# Patient Record
Sex: Male | Born: 1960 | Race: Black or African American | Hispanic: No | Smoking: Former smoker
Health system: Southern US, Community
[De-identification: ages and names within clinical notes are randomized; demographics above are authoritative.]

## PROBLEM LIST (undated history)

## (undated) DIAGNOSIS — Z720 Tobacco use: Secondary | ICD-10-CM

## (undated) DIAGNOSIS — Z9119 Patient's noncompliance with other medical treatment and regimen: Secondary | ICD-10-CM

## (undated) DIAGNOSIS — N486 Induration penis plastica: Secondary | ICD-10-CM

## (undated) DIAGNOSIS — I2510 Atherosclerotic heart disease of native coronary artery without angina pectoris: Secondary | ICD-10-CM

## (undated) DIAGNOSIS — E669 Obesity, unspecified: Secondary | ICD-10-CM

## (undated) DIAGNOSIS — C61 Malignant neoplasm of prostate: Secondary | ICD-10-CM

## (undated) DIAGNOSIS — Z91199 Patient's noncompliance with other medical treatment and regimen due to unspecified reason: Secondary | ICD-10-CM

## (undated) DIAGNOSIS — F1210 Cannabis abuse, uncomplicated: Secondary | ICD-10-CM

## (undated) DIAGNOSIS — I472 Ventricular tachycardia: Secondary | ICD-10-CM

## (undated) DIAGNOSIS — N529 Male erectile dysfunction, unspecified: Secondary | ICD-10-CM

## (undated) DIAGNOSIS — E119 Type 2 diabetes mellitus without complications: Secondary | ICD-10-CM

## (undated) DIAGNOSIS — E785 Hyperlipidemia, unspecified: Secondary | ICD-10-CM

## (undated) DIAGNOSIS — I10 Essential (primary) hypertension: Secondary | ICD-10-CM

## (undated) DIAGNOSIS — I4729 Other ventricular tachycardia: Secondary | ICD-10-CM

## (undated) HISTORY — DX: Induration penis plastica: N48.6

## (undated) HISTORY — PX: LAPAROSCOPIC RETROPUBIC PROSTATECTOMY: SUR794

## (undated) HISTORY — PX: OTHER SURGICAL HISTORY: SHX169

## (undated) HISTORY — DX: Male erectile dysfunction, unspecified: N52.9

## (undated) HISTORY — PX: TRACHEOSTOMY: SUR1362

---

## 2010-07-29 ENCOUNTER — Emergency Department (HOSPITAL_COMMUNITY): Admission: EM | Admit: 2010-07-29 | Discharge: 2010-07-29 | Payer: Self-pay | Admitting: Emergency Medicine

## 2011-08-21 ENCOUNTER — Emergency Department (HOSPITAL_COMMUNITY)
Admission: EM | Admit: 2011-08-21 | Discharge: 2011-08-21 | Disposition: A | Discharge status: Home / Self Care | Payer: BC Managed Care – PPO | Attending: Emergency Medicine | Admitting: Emergency Medicine

## 2011-08-21 ENCOUNTER — Encounter: Payer: Self-pay | Admitting: *Deleted

## 2011-08-21 DIAGNOSIS — H0011 Chalazion right upper eyelid: Secondary | ICD-10-CM

## 2011-08-21 DIAGNOSIS — H5710 Ocular pain, unspecified eye: Secondary | ICD-10-CM | POA: Insufficient documentation

## 2011-08-21 DIAGNOSIS — E785 Hyperlipidemia, unspecified: Secondary | ICD-10-CM | POA: Insufficient documentation

## 2011-08-21 DIAGNOSIS — I10 Essential (primary) hypertension: Secondary | ICD-10-CM | POA: Insufficient documentation

## 2011-08-21 DIAGNOSIS — H0019 Chalazion unspecified eye, unspecified eyelid: Secondary | ICD-10-CM | POA: Insufficient documentation

## 2011-08-21 HISTORY — DX: Hyperlipidemia, unspecified: E78.5

## 2011-08-21 HISTORY — DX: Essential (primary) hypertension: I10

## 2011-08-21 NOTE — ED Notes (Signed)
Patient states has had stye since the end of July. Had tried several OTC and home remedies. Denies any pain.

## 2011-08-21 NOTE — ED Notes (Signed)
Patient has tried otc treatment to the right eye lid.  Patient has noted swelling to the right upper eye lid.  Patient denies drainage.

## 2011-08-21 NOTE — ED Provider Notes (Signed)
History     CSN: [id number] Arrival date & time: 08/21/2011 10:45 AM   First MD Initiated Contact with Patient 08/21/11 1142      Chief Complaint  Patient presents with  . Eye Pain    (Consider location/radiation/quality/duration/timing/severity/associated sxs/prior treatment) Patient is a 50 y.o. male presenting with eye pain. The history is provided by the patient. No language interpreter was used.  Eye Pain This is a chronic problem. The current episode started more than 1 month ago. The problem has been unchanged. The symptoms are aggravated by nothing. The treatment provided no relief.  Patient with apparent chalazion upper lid of right eye.  Has been using an OTC stye ointment intermittently for several months without improvement.  No eye pain.  No visual disturbance.  Past Medical History  Diagnosis Date  . Hypertension   . Hyperlipemia     Past Surgical History  Procedure Date  . Tracheostomy     No family history on file.  History  Substance Use Topics  . Smoking status: Current Everyday Smoker  . Smokeless tobacco: Not on file  . Alcohol Use: Yes      Review of Systems  Eyes: Positive for pain.  All other systems reviewed and are negative.    Allergies  Penicillins  Home Medications   Current Outpatient Rx  Name Route Sig Dispense Refill  . AMLODIPINE BESYLATE 10 MG PO TABS Oral Take 10 mg by mouth daily.      Marland Kitchen PRESCRIPTION MEDICATION Oral Take 1 tablet by mouth daily. Patient does not know name of fluid pill. Is from [place], [place]. Doesn't know name of pharmacy.     Marland Kitchen SIMVASTATIN 10 MG PO TABS Oral Take 10 mg by mouth at bedtime.        BP 163/110  Pulse 92  Temp(Src) 98.3 F (36.8 C) (Oral)  Resp 20  SpO2 96%  Physical Exam  Constitutional: He is oriented to person, place, and time. He appears well-developed and well-nourished.  HENT:  Head: Normocephalic and atraumatic.  Eyes: Conjunctivae and EOM are normal. Pupils are equal, round,  and reactive to light. Right eye exhibits no discharge. Left eye exhibits no discharge. No scleral icterus.    Neck: Normal range of motion. Neck supple.  Cardiovascular: Normal rate and regular rhythm.   Pulmonary/Chest: Effort normal and breath sounds normal.  Abdominal: Soft. Bowel sounds are normal.  Musculoskeletal: Normal range of motion.  Neurological: He is alert and oriented to person, place, and time.  Skin: Skin is warm and dry.  Psychiatric: He has a normal mood and affect.    ED Course  Procedures (including critical care time)  Labs Reviewed - No data to display No results found.   No diagnosis found.   Needle drainage attempted without results.  Patient to follow-up with opthalmology. MDM          Jimmye Norman, NP 08/22/11 [phone]

## 2011-08-24 NOTE — ED Provider Notes (Signed)
Medical screening examination/treatment/procedure(s) were conducted as a shared visit with non-physician practitioner(s) and myself.  I personally evaluated the patient during the encounter. Right eye lid swelling. Mass on lid margin and in upper lid. Will need ophtho follow up.  Juliet Rude. Rubin Payor, MD 08/24/11 [phone]

## 2011-12-10 ENCOUNTER — Encounter (HOSPITAL_COMMUNITY): Payer: Self-pay | Admitting: Emergency Medicine

## 2011-12-10 ENCOUNTER — Emergency Department (HOSPITAL_COMMUNITY)
Admission: EM | Admit: 2011-12-10 | Discharge: 2011-12-11 | Disposition: A | Discharge status: Home / Self Care | Payer: BC Managed Care – PPO | Attending: Emergency Medicine | Admitting: Emergency Medicine

## 2011-12-10 DIAGNOSIS — S71009A Unspecified open wound, unspecified hip, initial encounter: Secondary | ICD-10-CM | POA: Insufficient documentation

## 2011-12-10 DIAGNOSIS — F17200 Nicotine dependence, unspecified, uncomplicated: Secondary | ICD-10-CM | POA: Insufficient documentation

## 2011-12-10 DIAGNOSIS — Y92009 Unspecified place in unspecified non-institutional (private) residence as the place of occurrence of the external cause: Secondary | ICD-10-CM | POA: Insufficient documentation

## 2011-12-10 DIAGNOSIS — S71109A Unspecified open wound, unspecified thigh, initial encounter: Secondary | ICD-10-CM | POA: Insufficient documentation

## 2011-12-10 DIAGNOSIS — S71112A Laceration without foreign body, left thigh, initial encounter: Secondary | ICD-10-CM

## 2011-12-10 DIAGNOSIS — Z880 Allergy status to penicillin: Secondary | ICD-10-CM | POA: Insufficient documentation

## 2011-12-10 DIAGNOSIS — Z23 Encounter for immunization: Secondary | ICD-10-CM | POA: Insufficient documentation

## 2011-12-10 DIAGNOSIS — W1789XA Other fall from one level to another, initial encounter: Secondary | ICD-10-CM | POA: Insufficient documentation

## 2011-12-10 DIAGNOSIS — E785 Hyperlipidemia, unspecified: Secondary | ICD-10-CM | POA: Insufficient documentation

## 2011-12-10 DIAGNOSIS — I10 Essential (primary) hypertension: Secondary | ICD-10-CM | POA: Insufficient documentation
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
## 2011-12-11 MED ORDER — TETANUS-DIPHTH-ACELL PERTUSSIS 5-2.5-18.5 LF-MCG/0.5 IM SUSP
0.5000 mL | Freq: Once | INTRAMUSCULAR | Status: AC
  Administered 2011-12-11: 0.5 mL via INTRAMUSCULAR
  Filled 2011-12-11: qty 0.5

## 2011-12-11 NOTE — ED Provider Notes (Signed)
Medical screening examination/treatment/procedure(s) were performed by non-physician practitioner and as supervising physician I was immediately available for consultation/collaboration.  Jasmine Awe, MD 12/11/11 [phone]
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
## 2011-12-11 NOTE — Discharge Instructions (Signed)
You were seen and treated today for your laceration. Your wound was cleaned with irrigation. You also had sutures placed to close your wound. These will need to be removed in 7-10 days. You may followup to primary care provider return to urgent care center or return to the emergency room to have been removed. If you develop any redness to the skin, increasing pain, leading or drainage, fever, chills please return to the emergency room.    Laceration Care, Adult A laceration is a cut or lesion that goes through all layers of the skin and into the tissue just beneath the skin. TREATMENT  Some lacerations may not require closure. Some lacerations may not be able to be closed due to an increased risk of infection. It is important to see your caregiver as soon as possible after an injury to minimize the risk of infection and maximize the opportunity for successful closure. If closure is appropriate, pain medicines may be given, if needed. The wound will be cleaned to help prevent infection. Your caregiver will use stitches (sutures), staples, wound glue (adhesive), or skin adhesive strips to repair the laceration. These tools bring the skin edges together to allow for faster healing and a better cosmetic outcome. However, all wounds will heal with a scar. Once the wound has healed, scarring can be minimized by covering the wound with sunscreen during the day for 1 full year. HOME CARE INSTRUCTIONS  For sutures or staples:  Keep the wound clean and dry.   If you were given a bandage (dressing), you should change it at least once a day. Also, change the dressing if it becomes wet or dirty, or as directed by your caregiver.   Wash the wound with soap and water 2 times a day. Rinse the wound off with water to remove all soap. Pat the wound dry with a clean towel.   After cleaning, apply a thin layer of the antibiotic ointment as recommended by your caregiver. This will help prevent infection and keep the  dressing from sticking.   You may shower as usual after the first 24 hours. Do not soak the wound in water until the sutures are removed.   Only take over-the-counter or prescription medicines for pain, discomfort, or fever as directed by your caregiver.   Get your sutures or staples removed as directed by your caregiver.  For skin adhesive strips:  Keep the wound clean and dry.   Do not get the skin adhesive strips wet. You may bathe carefully, using caution to keep the wound dry.   If the wound gets wet, pat it dry with a clean towel.   Skin adhesive strips will fall off on their own. You may trim the strips as the wound heals. Do not remove skin adhesive strips that are still stuck to the wound. They will fall off in time.  For wound adhesive:  You may briefly wet your wound in the shower or bath. Do not soak or scrub the wound. Do not swim. Avoid periods of heavy perspiration until the skin adhesive has fallen off on its own. After showering or bathing, gently pat the wound dry with a clean towel.   Do not apply liquid medicine, cream medicine, or ointment medicine to your wound while the skin adhesive is in place. This may loosen the film before your wound is healed.   If a dressing is placed over the wound, be careful not to apply tape directly over the skin adhesive. This may  cause the adhesive to be pulled off before the wound is healed.   Avoid prolonged exposure to sunlight or tanning lamps while the skin adhesive is in place. Exposure to ultraviolet light in the first year will darken the scar.   The skin adhesive will usually remain in place for 5 to 10 days, then naturally fall off the skin. Do not pick at the adhesive film.  You may need a tetanus shot if:  You cannot remember when you had your last tetanus shot.   You have never had a tetanus shot.  If you get a tetanus shot, your arm may swell, get red, and feel warm to the touch. This is common and not a problem. If  you need a tetanus shot and you choose not to have one, there is a rare chance of getting tetanus. Sickness from tetanus can be serious. SEEK MEDICAL CARE IF:   You have redness, swelling, or increasing pain in the wound.   You see a red line that goes away from the wound.   You have yellowish-white fluid (pus) coming from the wound.   You have a fever.   You notice a bad smell coming from the wound or dressing.   Your wound breaks open before or after sutures have been removed.   You notice something coming out of the wound such as wood or glass.   Your wound is on your hand or foot and you cannot move a finger or toe.  SEEK IMMEDIATE MEDICAL CARE IF:   Your pain is not controlled with prescribed medicine.   You have severe swelling around the wound causing pain and numbness or a change in color in your arm, hand, leg, or foot.   Your wound splits open and starts bleeding.   You have worsening numbness, weakness, or loss of function of any joint around or beyond the wound.   You develop painful lumps near the wound or on the skin anywhere on your body.  MAKE SURE YOU:   Understand these instructions.   Will watch your condition.   Will get help right away if you are not doing well or get worse.  Document Released: 08/31/2005 Document Revised: 08/20/2011 Document Reviewed: 02/24/2011 [facility] Patient Information 2012 [place], [place].   RESOURCE GUIDE  Dental Problems  Patients with Medicaid: [facility] [phone] [address]                                           [phone] Phone:  [phone]                                                  Phone:  [phone]  If unable to pay or uninsured, contact:  [facility] or [facility]. to become qualified for the adult dental clinic.  Chronic Pain Problems Contact [facility]  [phone] Patients need to be referred by their  primary care doctor.  Insufficient Money for Medicine Contact [employer] Way:  call "211" or [facility] [phone].  No Primary Care Doctor Call [facility]  [phone] Other agencies that provide inexpensive medical care    [facility]  [phone]    [facility]  [phone]    [facility]  [phone]    [facility]  [phone]    [facility]  [phone]    [facility]  [phone]  Psychological Services [facility]  [phone] [facility]  [phone] [facility]   [phone] (emergency services [phone])  Substance Abuse Resources [facility]  ([phone] [facility] ([phone] [facility] [phone] Floydene Flock [phone] Residential & Outpatient Substance Abuse Program  [phone]  Abuse/Neglect [facility] Child Abuse Hotline 435-539-1103 [facility] Main Child Abuse Hotline 430 250 8300 (After Hours)  Emergency Shelter [facility] [phone]  Maternity Homes [facility] at [facility] [phone] [facility] Services [phone]  MRSA Hotline #:   815 249 2661    [facility] Resources  [facility]     [employer]                          [facility] Dept. [address] [place]                       [address]      [address]  Blondell Reveal Phone:  [phone]                                   Phone:  [phone]                 Phone:  [phone]  [facility] Phone:  [phone]  [facility] Child Abuse Hotline 660-854-5335 [phone] (After Hours)

## 2011-12-17 ENCOUNTER — Emergency Department (HOSPITAL_COMMUNITY)
Admission: EM | Admit: 2011-12-17 | Discharge: 2011-12-17 | Disposition: A | Discharge status: Home / Self Care | Payer: BC Managed Care – PPO | Attending: Emergency Medicine | Admitting: Emergency Medicine

## 2011-12-17 ENCOUNTER — Encounter (HOSPITAL_COMMUNITY): Payer: Self-pay

## 2011-12-17 DIAGNOSIS — Z4802 Encounter for removal of sutures: Secondary | ICD-10-CM | POA: Insufficient documentation

## 2011-12-17 DIAGNOSIS — I10 Essential (primary) hypertension: Secondary | ICD-10-CM | POA: Insufficient documentation

## 2011-12-17 DIAGNOSIS — E785 Hyperlipidemia, unspecified: Secondary | ICD-10-CM | POA: Insufficient documentation

## 2011-12-17 DIAGNOSIS — F17200 Nicotine dependence, unspecified, uncomplicated: Secondary | ICD-10-CM | POA: Insufficient documentation

## 2011-12-17 NOTE — ED Provider Notes (Signed)
History     CSN: [id number]  Arrival date & time 12/17/11  4098   First MD Initiated Contact with Patient 12/17/11 [phone]      Chief Complaint  Patient presents with  . Suture / Staple Removal     The history is provided by the patient.   laceration repaired 7 days ago.  Patient presents for suture removal.  No report of erythema or drainage. No other complaints. No fevers or chills. No weakness or numbness  Past Medical History  Diagnosis Date  . Hypertension   . Hyperlipemia     Past Surgical History  Procedure Date  . Tracheostomy     No family history on file.  History  Substance Use Topics  . Smoking status: Current Everyday Smoker  . Smokeless tobacco: Not on file  . Alcohol Use: Yes      Review of Systems  All other systems reviewed and are negative.    Allergies  Penicillins  Home Medications   Current Outpatient Rx  Name Route Sig Dispense Refill  . AMLODIPINE BESYLATE 10 MG PO TABS Oral Take 10 mg by mouth daily.      Marland Kitchen SIMVASTATIN 10 MG PO TABS Oral Take 10 mg by mouth at bedtime.        BP 140/96  Pulse 97  Temp(Src) 98.2 F (36.8 C) (Oral)  Resp 16  SpO2 99%  Physical Exam  Nursing note and vitals reviewed. Constitutional: He is oriented to person, place, and time. He appears well-developed and well-nourished.  HENT:  Head: Normocephalic and atraumatic.  Eyes: EOM are normal.  Neck: Normal range of motion.  Pulmonary/Chest: Effort normal.  Musculoskeletal: Normal range of motion.       Healed laceration of left lateral thigh without secondary signs of infection. 3 sutures in place  Neurological: He is alert and oriented to person, place, and time.  Skin: Skin is warm and dry.  Psychiatric: He has a normal mood and affect. Judgment normal.    ED Course  Procedures (including critical care time) SUTURE REMOVAL Performed by: Lyanne Co Consent: Verbal consent obtained. Patient identity confirmed: provided demographic  data Time out: Immediately prior to procedure a "time out" was called to verify the correct patient, procedure, equipment, support staff and site/side marked as required. Location: left thigh Wound Appearance: clean Sutures/Staples Removed: 3 Patient tolerance: Patient tolerated the procedure well with no immediate complications.    Labs Reviewed - No data to display No results found.   1. Visit for suture removal       MDM  Well healed.  Sutures removed.  Infection warnings given.  Steri-Strips applied for additional support        Lyanne Co, MD 12/17/11 [phone]

## 2011-12-17 NOTE — Discharge Instructions (Signed)
Scar Minimization You will have a scar anytime you have surgery and a cut is made in the skin or you have something removed from your skin (mole, skin cancer, cyst). Although scars are unavoidable following surgery, there are ways to minimize their appearance. It is important to follow all the instructions you receive from your caregiver about wound care. How your wound heals will influence the appearance of your scar. If you do not follow the wound care instructions as directed, complications such as infection may occur. Wound instructions include keeping the wound clean, moist, and not letting the wound form a scab. Some people form scars that are raised and lumpy (hypertrophic) or larger than the initial wound (keloidal). HOME CARE INSTRUCTIONS   Follow wound care instructions as directed.   Keep the wound clean by washing it with soap and water.   Keep the wound moist with provided antibiotic cream or petroleum jelly until completely healed. Moisten twice a day for about 2 weeks.   Get stitches (sutures) taken out at the scheduled time.   Avoid touching or manipulating your wound unless needed. Wash your hands thoroughly before and after touching your wound.   Follow all restrictions such as limits on exercise or work. This depends on where your scar is located.   Keep the scar protected from sunburn. Cover the scar with sunscreen/sunblock with SPF 30 or higher.   Gently massage the scar using a circular motion to help minimize the appearance of the scar. Do this only after the wound has closed and all the sutures have been removed.   For hypertrophic or keloidal scars, there are several ways to treat and minimize their appearance. Methods include compression therapy, intralesional corticosteroids, laser therapy, or surgery. These methods are performed by your caregiver.  Remember that the scar may appear lighter or darker than your normal skin color. This difference in color should even  out with time. SEEK MEDICAL CARE IF:   You have a fever.   You develop signs of infection such as pain, redness, pus, and warmth.   You have questions or concerns.  Document Released: 02/18/2010 Document Revised: 08/20/2011 Document Reviewed: 02/18/2010 [employer] Patient Information 2012 [employer]. 

## 2011-12-17 NOTE — ED Notes (Signed)
Suture removal of lt. leg

## 2012-02-09 ENCOUNTER — Encounter (HOSPITAL_COMMUNITY): Payer: Self-pay | Admitting: *Deleted

## 2012-02-09 ENCOUNTER — Emergency Department (INDEPENDENT_AMBULATORY_CARE_PROVIDER_SITE_OTHER): Payer: BC Managed Care – PPO

## 2012-02-09 ENCOUNTER — Emergency Department (INDEPENDENT_AMBULATORY_CARE_PROVIDER_SITE_OTHER)
Admission: EM | Admit: 2012-02-09 | Discharge: 2012-02-09 | Disposition: A | Discharge status: Home / Self Care | Payer: BC Managed Care – PPO | Source: Home / Self Care | Attending: Family Medicine | Admitting: Family Medicine

## 2012-02-09 DIAGNOSIS — M79609 Pain in unspecified limb: Secondary | ICD-10-CM

## 2012-02-09 DIAGNOSIS — M79606 Pain in leg, unspecified: Secondary | ICD-10-CM

## 2012-02-09 MED ORDER — KETOROLAC TROMETHAMINE 30 MG/ML IJ SOLN
30.0000 mg | Freq: Once | INTRAMUSCULAR | Status: AC
  Administered 2012-02-09: 30 mg via INTRAMUSCULAR

## 2012-02-09 MED ORDER — KETOROLAC TROMETHAMINE 30 MG/ML IJ SOLN
INTRAMUSCULAR | Status: AC
  Filled 2012-02-09: qty 1

## 2012-02-09 MED ORDER — KETOROLAC TROMETHAMINE 60 MG/2ML IM SOLN
INTRAMUSCULAR | Status: AC
  Filled 2012-02-09: qty 2

## 2012-02-09 MED ORDER — DICLOFENAC POTASSIUM 50 MG PO TABS: 50.0000 mg | ORAL_TABLET | Freq: Three times a day (TID) | ORAL | Status: DC

## 2012-02-09 NOTE — ED Notes (Addendum)
C/o pain in left upper leg (posteior leg) after jumping into a pool yesterday. Has been using a heating pad and taking ibuprofen for pain without relief.

## 2012-02-09 NOTE — Discharge Instructions (Signed)
Use crutches, heat and medicine as needed, see orthopedic doctor for further care as needed.
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
## 2012-02-09 NOTE — ED Notes (Signed)
Patient transported to X-ray 

## 2012-02-09 NOTE — ED Notes (Signed)
MD at bedside. 

## 2012-06-21 ENCOUNTER — Emergency Department (INDEPENDENT_AMBULATORY_CARE_PROVIDER_SITE_OTHER)
Admission: EM | Admit: 2012-06-21 | Discharge: 2012-06-21 | Disposition: A | Discharge status: Home / Self Care | Payer: BC Managed Care – PPO | Source: Home / Self Care

## 2012-06-21 ENCOUNTER — Encounter (HOSPITAL_COMMUNITY): Payer: Self-pay | Admitting: *Deleted

## 2012-06-21 DIAGNOSIS — M25511 Pain in right shoulder: Secondary | ICD-10-CM

## 2012-06-21 DIAGNOSIS — M25519 Pain in unspecified shoulder: Secondary | ICD-10-CM

## 2012-06-21 MED ORDER — DICLOFENAC POTASSIUM 50 MG PO TABS: 50.0000 mg | ORAL_TABLET | Freq: Two times a day (BID) | ORAL | Status: AC

## 2012-06-21 MED ORDER — ACETAMINOPHEN-CODEINE #3 300-30 MG PO TABS: 1.0000 | ORAL_TABLET | Freq: Four times a day (QID) | ORAL | Status: DC | PRN

## 2012-06-21 MED ORDER — CYCLOBENZAPRINE HCL 10 MG PO TABS: 10.0000 mg | ORAL_TABLET | Freq: Two times a day (BID) | ORAL | Status: DC

## 2012-06-21 MED ORDER — KETOROLAC TROMETHAMINE 60 MG/2ML IM SOLN
60.0000 mg | Freq: Once | INTRAMUSCULAR | Status: AC
  Administered 2012-06-21: 60 mg via INTRAMUSCULAR

## 2012-06-21 MED ORDER — KETOROLAC TROMETHAMINE 60 MG/2ML IM SOLN
INTRAMUSCULAR | Status: AC
  Filled 2012-06-21: qty 2

## 2012-06-21 NOTE — ED Provider Notes (Signed)
History     CSN: [id number]  Arrival date & time 06/21/12  4098   None     Chief Complaint  Patient presents with  . Shoulder Pain    (Consider location/radiation/quality/duration/timing/severity/associated sxs/prior treatment) Patient is a 51 y.o. male presenting with shoulder pain. The history is provided by the patient.  Shoulder Pain This is a new problem. The current episode started more than 2 days ago. The problem has not changed since onset.Pertinent negatives include no chest pain and no headaches. The symptoms are aggravated by bending. The symptoms are relieved by heat and NSAIDs. He has tried a warm compress for the symptoms. The treatment provided no relief.  PMH-right rotator cuff injury.  Past Medical History  Diagnosis Date  . Hypertension   . Hyperlipemia   . High cholesterol     Past Surgical History  Procedure Date  . Tracheostomy     Family History  Problem Relation Age of Onset  . Parkinsonism Mother   . Cancer Father   . Cancer Brother     History  Substance Use Topics  . Smoking status: Current Every Day Smoker  . Smokeless tobacco: Not on file  . Alcohol Use: No      Review of Systems  Constitutional: Negative.   Respiratory: Negative.   Cardiovascular: Negative.  Negative for chest pain.  Musculoskeletal: Positive for arthralgias. Negative for myalgias, back pain, joint swelling and gait problem.  Skin: Negative.   Neurological: Positive for numbness. Negative for dizziness, tremors, seizures, syncope, facial asymmetry, speech difficulty, weakness, light-headedness and headaches.       Right thumb    Allergies  Penicillins  Home Medications   Current Outpatient Rx  Name Route Sig Dispense Refill  . AMLODIPINE BESYLATE 10 MG PO TABS Oral Take 10 mg by mouth daily.      . ENALAPRIL MALEATE 10 MG PO TABS Oral Take 10 mg by mouth daily.    Marland Kitchen SIMVASTATIN 10 MG PO TABS Oral Take 10 mg by mouth at bedtime.      .  ACETAMINOPHEN-CODEINE #3 300-30 MG PO TABS Oral Take 1 tablet by mouth every 6 (six) hours as needed for pain. 30 tablet 0  . CYCLOBENZAPRINE HCL 10 MG PO TABS Oral Take 1 tablet (10 mg total) by mouth 2 (two) times daily. Do not take if driving. 30 tablet 0  . DICLOFENAC POTASSIUM 50 MG PO TABS Oral Take 1 tablet (50 mg total) by mouth 2 (two) times daily. 60 tablet 2    BP 156/73  Pulse 93  Temp 98 F (36.7 C) (Oral)  SpO2 100%  Physical Exam  Nursing note and vitals reviewed. Constitutional: He is oriented to person, place, and time. Vital signs are normal. He appears well-developed and well-nourished. He is active and cooperative.  HENT:  Head: Normocephalic.  Eyes: Conjunctivae normal are normal. Pupils are equal, round, and reactive to light. No scleral icterus.  Neck: Trachea normal. Neck supple.  Cardiovascular: Normal rate and regular rhythm.   Pulmonary/Chest: Effort normal and breath sounds normal.  Musculoskeletal:       Right shoulder: He exhibits decreased range of motion, tenderness and spasm. He exhibits no bony tenderness, no swelling, no effusion, no crepitus, no deformity, no laceration, no pain, normal pulse and normal strength.       Left shoulder: Normal.       Cervical back: Normal.       Back:       Decreased ROM  secondary to pain, spasms upper back paracervical spine  Neurological: He is alert and oriented to person, place, and time. He has normal strength and normal reflexes. No cranial nerve deficit or sensory deficit. Coordination and gait normal. GCS eye subscore is 4. GCS verbal subscore is 5. GCS motor subscore is 6.  Skin: Skin is warm and dry.  Psychiatric: He has a normal mood and affect. His speech is normal and behavior is normal. Judgment and thought content normal. Cognition and memory are normal.    ED Course  Procedures (including critical care time)  Labs Reviewed - No data to display No results found.   1. Right shoulder pain        MDM  toradol in office, muscle relaxants, NSAIDS, tylenol with codeine for breakthrough pain; follow up with orthopedist in one week, cannot rule out rotator cuff injury.        Johnsie Kindred, NP 06/21/12 1011

## 2012-06-21 NOTE — ED Notes (Signed)
Right Shoulder Pain - onset 3 days ago - Hx. Rotator Cuff tear in same shoulder - no surgery . Sudden onset after working out - had aching and stated with severe pain last PM- C/O limited ROM and some numbness in right thumb and strange feeling in elbow.

## 2012-06-21 NOTE — ED Provider Notes (Signed)
Medical screening examination/treatment/procedure(s) were performed by resident physician or non-physician practitioner and as supervising physician I was immediately available for consultation/collaboration.   Barkley Bruns MD.    Linna Hoff, MD 06/21/12 1034
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
## 2013-08-22 ENCOUNTER — Other Ambulatory Visit (HOSPITAL_COMMUNITY): Payer: Self-pay | Admitting: Urology

## 2013-08-22 DIAGNOSIS — N400 Enlarged prostate without lower urinary tract symptoms: Secondary | ICD-10-CM

## 2013-08-23 ENCOUNTER — Ambulatory Visit (HOSPITAL_COMMUNITY): Payer: BC Managed Care – PPO | Attending: Urology

## 2014-07-11 ENCOUNTER — Inpatient Hospital Stay (HOSPITAL_COMMUNITY)
Admission: EM | Admit: 2014-07-11 | Discharge: 2014-07-15 | DRG: 247 | Disposition: A | Discharge status: Home / Self Care | Payer: BC Managed Care – PPO | Attending: Cardiology | Admitting: Cardiology

## 2014-07-11 ENCOUNTER — Encounter (HOSPITAL_COMMUNITY): Payer: Self-pay | Admitting: Emergency Medicine

## 2014-07-11 ENCOUNTER — Emergency Department (HOSPITAL_COMMUNITY): Payer: BC Managed Care – PPO

## 2014-07-11 ENCOUNTER — Encounter (HOSPITAL_COMMUNITY)
Admission: EM | Disposition: A | Discharge status: Home / Self Care | Payer: BC Managed Care – PPO | Source: Home / Self Care | Attending: Cardiology

## 2014-07-11 DIAGNOSIS — Z720 Tobacco use: Secondary | ICD-10-CM | POA: Diagnosis present

## 2014-07-11 DIAGNOSIS — I10 Essential (primary) hypertension: Secondary | ICD-10-CM | POA: Diagnosis present

## 2014-07-11 DIAGNOSIS — E780 Pure hypercholesterolemia: Secondary | ICD-10-CM | POA: Diagnosis present

## 2014-07-11 DIAGNOSIS — Z79899 Other long term (current) drug therapy: Secondary | ICD-10-CM

## 2014-07-11 DIAGNOSIS — Z955 Presence of coronary angioplasty implant and graft: Secondary | ICD-10-CM

## 2014-07-11 DIAGNOSIS — E785 Hyperlipidemia, unspecified: Secondary | ICD-10-CM

## 2014-07-11 DIAGNOSIS — I25110 Atherosclerotic heart disease of native coronary artery with unstable angina pectoris: Secondary | ICD-10-CM

## 2014-07-11 DIAGNOSIS — I214 Non-ST elevation (NSTEMI) myocardial infarction: Secondary | ICD-10-CM | POA: Diagnosis present

## 2014-07-11 DIAGNOSIS — Z8546 Personal history of malignant neoplasm of prostate: Secondary | ICD-10-CM

## 2014-07-11 DIAGNOSIS — I213 ST elevation (STEMI) myocardial infarction of unspecified site: Secondary | ICD-10-CM

## 2014-07-11 DIAGNOSIS — I2119 ST elevation (STEMI) myocardial infarction involving other coronary artery of inferior wall: Secondary | ICD-10-CM

## 2014-07-11 DIAGNOSIS — C61 Malignant neoplasm of prostate: Secondary | ICD-10-CM | POA: Diagnosis present

## 2014-07-11 DIAGNOSIS — F1210 Cannabis abuse, uncomplicated: Secondary | ICD-10-CM | POA: Diagnosis present

## 2014-07-11 DIAGNOSIS — E1169 Type 2 diabetes mellitus with other specified complication: Secondary | ICD-10-CM

## 2014-07-11 DIAGNOSIS — I2510 Atherosclerotic heart disease of native coronary artery without angina pectoris: Secondary | ICD-10-CM | POA: Diagnosis present

## 2014-07-11 DIAGNOSIS — I209 Angina pectoris, unspecified: Secondary | ICD-10-CM

## 2014-07-11 DIAGNOSIS — E119 Type 2 diabetes mellitus without complications: Secondary | ICD-10-CM | POA: Diagnosis present

## 2014-07-11 DIAGNOSIS — R079 Chest pain, unspecified: Secondary | ICD-10-CM | POA: Diagnosis present

## 2014-07-11 DIAGNOSIS — I4729 Other ventricular tachycardia: Secondary | ICD-10-CM

## 2014-07-11 DIAGNOSIS — E669 Obesity, unspecified: Secondary | ICD-10-CM | POA: Diagnosis present

## 2014-07-11 DIAGNOSIS — I2111 ST elevation (STEMI) myocardial infarction involving right coronary artery: Secondary | ICD-10-CM

## 2014-07-11 DIAGNOSIS — F17210 Nicotine dependence, cigarettes, uncomplicated: Secondary | ICD-10-CM | POA: Diagnosis present

## 2014-07-11 DIAGNOSIS — I472 Ventricular tachycardia, unspecified: Secondary | ICD-10-CM

## 2014-07-11 DIAGNOSIS — Z6829 Body mass index (BMI) 29.0-29.9, adult: Secondary | ICD-10-CM

## 2014-07-11 DIAGNOSIS — I519 Heart disease, unspecified: Secondary | ICD-10-CM

## 2014-07-11 HISTORY — DX: Cannabis abuse, uncomplicated: F12.10

## 2014-07-11 HISTORY — DX: Type 2 diabetes mellitus without complications: E11.9

## 2014-07-11 HISTORY — DX: Patient's noncompliance with other medical treatment and regimen: Z91.19

## 2014-07-11 HISTORY — DX: Tobacco use: Z72.0

## 2014-07-11 HISTORY — DX: Ventricular tachycardia: I47.2

## 2014-07-11 HISTORY — DX: Atherosclerotic heart disease of native coronary artery without angina pectoris: I25.10

## 2014-07-11 HISTORY — PX: LEFT HEART CATHETERIZATION WITH CORONARY ANGIOGRAM: SHX5451

## 2014-07-11 HISTORY — DX: Patient's noncompliance with other medical treatment and regimen due to unspecified reason: Z91.199

## 2014-07-11 HISTORY — DX: Other ventricular tachycardia: I47.29

## 2014-07-11 HISTORY — DX: Malignant neoplasm of prostate: C61

## 2014-07-11 HISTORY — DX: Obesity, unspecified: E66.9

## 2014-07-11 LAB — CBC
HCT: 46.2 % (ref 39.0–52.0)
HCT: 44.3 % (ref 39.0–52.0)
Hemoglobin: 16.2 g/dL (ref 13.0–17.0)
Hemoglobin: 15.6 g/dL (ref 13.0–17.0)
MCH: 30.7 pg (ref 26.0–34.0)
MCH: 30.1 pg (ref 26.0–34.0)
MCHC: 35.1 g/dL (ref 30.0–36.0)
MCHC: 35.2 g/dL (ref 30.0–36.0)
MCV: 87.5 fL (ref 78.0–100.0)
MCV: 85.5 fL (ref 78.0–100.0)
PLATELETS: 295 10*3/uL (ref 150–400)
Platelets: 309 10*3/uL (ref 150–400)
RBC: 5.28 MIL/uL (ref 4.22–5.81)
RBC: 5.18 MIL/uL (ref 4.22–5.81)
RDW: 14.4 % (ref 11.5–15.5)
RDW: 14.4 % (ref 11.5–15.5)
WBC: 9.5 10*3/uL (ref 4.0–10.5)
WBC: 9.6 10*3/uL (ref 4.0–10.5)

## 2014-07-11 LAB — GLUCOSE, CAPILLARY
Glucose-Capillary: 163 mg/dL — ABNORMAL HIGH (ref 70–99)
Glucose-Capillary: 190 mg/dL — ABNORMAL HIGH (ref 70–99)
Glucose-Capillary: 190 mg/dL — ABNORMAL HIGH (ref 70–99)

## 2014-07-11 LAB — CREATININE, SERUM
Creatinine, Ser: 0.94 mg/dL (ref 0.50–1.35)
GFR calc Af Amer: >90 mL/min
GFR calc non Af Amer: >90 mL/min

## 2014-07-11 LAB — BASIC METABOLIC PANEL
ANION GAP: 18 — AB (ref 5–15)
BUN: 17 mg/dL (ref 6–23)
CHLORIDE: 98 meq/L (ref 96–112)
CO2: 23 mEq/L (ref 19–32)
Calcium: 10.2 mg/dL (ref 8.4–10.5)
Creatinine, Ser: 1.22 mg/dL (ref 0.50–1.35)
GFR, EST AFRICAN AMERICAN: 77 mL/min — AB (ref >90)
GFR, EST NON AFRICAN AMERICAN: 67 mL/min — AB (ref >90)
Glucose, Bld: 200 mg/dL — ABNORMAL HIGH (ref 70–99)
Potassium: 3.8 mEq/L (ref 3.7–5.3)
Sodium: 139 mEq/L (ref 137–147)

## 2014-07-11 LAB — HEMOGLOBIN A1C
Hgb A1c MFr Bld: 7.3 % — ABNORMAL HIGH
Mean Plasma Glucose: 163 mg/dL — ABNORMAL HIGH

## 2014-07-11 LAB — TROPONIN I
Troponin I: >20 ng/mL
Troponin I: >20 ng/mL
Troponin I: >20 ng/mL

## 2014-07-11 LAB — PRO B NATRIURETIC PEPTIDE: PRO B NATRI PEPTIDE: 13.9 pg/mL (ref 0–125)

## 2014-07-11 LAB — MRSA PCR SCREENING: MRSA by PCR: NEGATIVE

## 2014-07-11 LAB — I-STAT TROPONIN, ED: Troponin i, poc: 0.86 ng/mL (ref 0.00–0.08)

## 2014-07-11 LAB — TSH: TSH: 1.26 u[IU]/mL (ref 0.350–4.500)

## 2014-07-11 LAB — POCT ACTIVATED CLOTTING TIME: Activated Clotting Time: 484 seconds

## 2014-07-11 SURGERY — LEFT HEART CATHETERIZATION WITH CORONARY ANGIOGRAM
Anesthesia: LOCAL

## 2014-07-11 MED ORDER — NITROGLYCERIN 0.4 MG SL SUBL
0.4000 mg | SUBLINGUAL_TABLET | SUBLINGUAL | Status: DC | PRN
Start: 2014-07-11 — End: 2014-07-15

## 2014-07-11 MED ORDER — NITROGLYCERIN 0.4 MG SL SUBL
0.4000 mg | SUBLINGUAL_TABLET | SUBLINGUAL | Status: DC | PRN
  Administered 2014-07-11 (×3): 0.4 mg via SUBLINGUAL

## 2014-07-11 MED ORDER — CAPTOPRIL 6.25 MG HALF TABLET
6.2500 mg | ORAL_TABLET | Freq: Three times a day (TID) | ORAL | Status: AC
  Administered 2014-07-11 (×3): 6.25 mg via ORAL
  Filled 2014-07-11 (×3): qty 1

## 2014-07-11 MED ORDER — NITROGLYCERIN IN D5W 200-5 MCG/ML-% IV SOLN
2.0000 ug/min | INTRAVENOUS | Status: DC
  Administered 2014-07-11: 50 ug/min via INTRAVENOUS
  Filled 2014-07-11: qty 250

## 2014-07-11 MED ORDER — HEPARIN (PORCINE) IN NACL 100-0.45 UNIT/ML-% IJ SOLN
1100.0000 [IU]/h | INTRAMUSCULAR | Status: DC
  Filled 2014-07-11: qty 250

## 2014-07-11 MED ORDER — INSULIN ASPART 100 UNIT/ML ~~LOC~~ SOLN
0.0000 [IU] | Freq: Three times a day (TID) | SUBCUTANEOUS | Status: DC
  Administered 2014-07-11 (×2): 3 [IU] via SUBCUTANEOUS
  Administered 2014-07-12: 2 [IU] via SUBCUTANEOUS
  Administered 2014-07-12 (×2): 3 [IU] via SUBCUTANEOUS
  Administered 2014-07-13: 2 [IU] via SUBCUTANEOUS
  Administered 2014-07-13: 3 [IU] via SUBCUTANEOUS
  Administered 2014-07-13 – 2014-07-14 (×3): 2 [IU] via SUBCUTANEOUS
  Administered 2014-07-15: 3 [IU] via SUBCUTANEOUS
  Administered 2014-07-15: 2 [IU] via SUBCUTANEOUS

## 2014-07-11 MED ORDER — HEPARIN SODIUM (PORCINE) 1000 UNIT/ML IJ SOLN
INTRAMUSCULAR | Status: AC
  Filled 2014-07-11: qty 1

## 2014-07-11 MED ORDER — HEPARIN SODIUM (PORCINE) 5000 UNIT/ML IJ SOLN
4000.0000 [IU] | Freq: Once | INTRAMUSCULAR | Status: AC
  Administered 2014-07-11: 4000 [IU] via INTRAVENOUS
  Filled 2014-07-11: qty 1

## 2014-07-11 MED ORDER — ATORVASTATIN CALCIUM 80 MG PO TABS
80.0000 mg | ORAL_TABLET | Freq: Every day | ORAL | Status: DC
  Administered 2014-07-11 – 2014-07-14 (×4): 80 mg via ORAL
  Filled 2014-07-11 (×6): qty 1

## 2014-07-11 MED ORDER — ACETAMINOPHEN 325 MG PO TABS
650.0000 mg | ORAL_TABLET | ORAL | Status: DC | PRN
  Administered 2014-07-12: 650 mg via ORAL
  Filled 2014-07-11: qty 2

## 2014-07-11 MED ORDER — NITROGLYCERIN 1 MG/10 ML FOR IR/CATH LAB
INTRA_ARTERIAL | Status: AC
  Filled 2014-07-11: qty 10

## 2014-07-11 MED ORDER — MORPHINE SULFATE 2 MG/ML IJ SOLN
INTRAMUSCULAR | Status: AC
  Filled 2014-07-11: qty 1

## 2014-07-11 MED ORDER — NITROGLYCERIN IN D5W 200-5 MCG/ML-% IV SOLN: 0.0000 ug/min | INTRAVENOUS | Status: DC

## 2014-07-11 MED ORDER — METOPROLOL TARTRATE 12.5 MG HALF TABLET
12.5000 mg | ORAL_TABLET | Freq: Two times a day (BID) | ORAL | Status: DC
  Administered 2014-07-11 (×2): 12.5 mg via ORAL
  Filled 2014-07-11 (×4): qty 1

## 2014-07-11 MED ORDER — ONDANSETRON HCL 4 MG/2ML IJ SOLN
4.0000 mg | Freq: Once | INTRAMUSCULAR | Status: AC
  Administered 2014-07-11: 4 mg via INTRAVENOUS

## 2014-07-11 MED ORDER — ACETAMINOPHEN 325 MG PO TABS: 650.0000 mg | ORAL_TABLET | ORAL | Status: DC | PRN

## 2014-07-11 MED ORDER — MORPHINE SULFATE 4 MG/ML IJ SOLN
4.0000 mg | Freq: Once | INTRAMUSCULAR | Status: AC
  Administered 2014-07-11: 2 mg via INTRAVENOUS
  Filled 2014-07-11: qty 1

## 2014-07-11 MED ORDER — MIDAZOLAM HCL 2 MG/2ML IJ SOLN
INTRAMUSCULAR | Status: AC
  Filled 2014-07-11: qty 2

## 2014-07-11 MED ORDER — ASPIRIN EC 81 MG PO TBEC: 81.0000 mg | DELAYED_RELEASE_TABLET | Freq: Every day | ORAL | Status: DC

## 2014-07-11 MED ORDER — MORPHINE SULFATE 2 MG/ML IJ SOLN
2.0000 mg | INTRAMUSCULAR | Status: DC | PRN
  Administered 2014-07-11 – 2014-07-13 (×4): 2 mg via INTRAVENOUS
  Filled 2014-07-11 (×3): qty 1

## 2014-07-11 MED ORDER — ONDANSETRON HCL 4 MG/2ML IJ SOLN
INTRAMUSCULAR | Status: AC
  Filled 2014-07-11: qty 2

## 2014-07-11 MED ORDER — SODIUM CHLORIDE 0.9 % IJ SOLN: 3.0000 mL | INTRAMUSCULAR | Status: DC | PRN

## 2014-07-11 MED ORDER — HEPARIN BOLUS VIA INFUSION
4000.0000 [IU] | Freq: Once | INTRAVENOUS | Status: DC
  Filled 2014-07-11: qty 4000

## 2014-07-11 MED ORDER — VERAPAMIL HCL 2.5 MG/ML IV SOLN
INTRAVENOUS | Status: AC
  Filled 2014-07-11: qty 2

## 2014-07-11 MED ORDER — AMIODARONE IV BOLUS ONLY 150 MG/100ML
150.0000 mg | Freq: Once | INTRAVENOUS | Status: AC
  Administered 2014-07-11: 150 mg via INTRAVENOUS

## 2014-07-11 MED ORDER — ASPIRIN 81 MG PO CHEW: 81.0000 mg | CHEWABLE_TABLET | Freq: Every day | ORAL | Status: DC

## 2014-07-11 MED ORDER — SODIUM CHLORIDE 0.9 % IV SOLN: 250.0000 mL | INTRAVENOUS | Status: DC | PRN

## 2014-07-11 MED ORDER — ASPIRIN 81 MG PO CHEW
81.0000 mg | CHEWABLE_TABLET | Freq: Every day | ORAL | Status: DC
  Administered 2014-07-12 – 2014-07-13 (×2): 81 mg via ORAL
  Filled 2014-07-11 (×2): qty 1

## 2014-07-11 MED ORDER — ACETAMINOPHEN-CODEINE #3 300-30 MG PO TABS: 1.0000 | ORAL_TABLET | Freq: Four times a day (QID) | ORAL | Status: DC | PRN

## 2014-07-11 MED ORDER — SODIUM CHLORIDE 0.9 % IV SOLN
1.0000 mL/kg/h | INTRAVENOUS | Status: AC
  Administered 2014-07-11: 1 mL/kg/h via INTRAVENOUS

## 2014-07-11 MED ORDER — BIVALIRUDIN 250 MG IV SOLR
INTRAVENOUS | Status: AC
  Filled 2014-07-11: qty 250

## 2014-07-11 MED ORDER — FENTANYL CITRATE 0.05 MG/ML IJ SOLN
INTRAMUSCULAR | Status: AC
  Filled 2014-07-11: qty 2

## 2014-07-11 MED ORDER — PROMETHAZINE HCL 25 MG/ML IJ SOLN
12.5000 mg | INTRAMUSCULAR | Status: DC | PRN
  Administered 2014-07-11: 12.5 mg via INTRAVENOUS
  Administered 2014-07-12: 25 mg via INTRAVENOUS
  Filled 2014-07-11 (×2): qty 1

## 2014-07-11 MED ORDER — ASPIRIN 81 MG PO CHEW
CHEWABLE_TABLET | ORAL | Status: AC
  Administered 2014-07-11: 324 mg via ORAL
  Filled 2014-07-11: qty 4

## 2014-07-11 MED ORDER — SODIUM CHLORIDE 0.9 % IJ SOLN
3.0000 mL | Freq: Two times a day (BID) | INTRAMUSCULAR | Status: DC
  Administered 2014-07-11 – 2014-07-15 (×8): 3 mL via INTRAVENOUS

## 2014-07-11 MED ORDER — HEPARIN (PORCINE) IN NACL 2-0.9 UNIT/ML-% IJ SOLN
INTRAMUSCULAR | Status: AC
  Filled 2014-07-11: qty 1000

## 2014-07-11 MED ORDER — TICAGRELOR 90 MG PO TABS
90.0000 mg | ORAL_TABLET | Freq: Two times a day (BID) | ORAL | Status: DC
  Administered 2014-07-11 – 2014-07-15 (×7): 90 mg via ORAL
  Filled 2014-07-11 (×9): qty 1

## 2014-07-11 MED ORDER — MORPHINE SULFATE 2 MG/ML IJ SOLN
INTRAMUSCULAR | Status: AC
  Administered 2014-07-11: 2 mg via INTRAVENOUS
  Filled 2014-07-11: qty 1

## 2014-07-11 MED ORDER — LIDOCAINE HCL (PF) 1 % IJ SOLN
INTRAMUSCULAR | Status: AC
  Filled 2014-07-11: qty 30

## 2014-07-11 MED ORDER — ASPIRIN 81 MG PO CHEW
324.0000 mg | CHEWABLE_TABLET | Freq: Once | ORAL | Status: AC
  Administered 2014-07-11: 324 mg via ORAL
  Filled 2014-07-11: qty 4

## 2014-07-11 MED ORDER — HYDRALAZINE HCL 20 MG/ML IJ SOLN
10.0000 mg | INTRAMUSCULAR | Status: DC | PRN
  Administered 2014-07-11 – 2014-07-12 (×4): 10 mg via INTRAVENOUS
  Filled 2014-07-11 (×4): qty 1

## 2014-07-11 MED ORDER — ONDANSETRON HCL 4 MG/2ML IJ SOLN
4.0000 mg | Freq: Four times a day (QID) | INTRAMUSCULAR | Status: DC | PRN
  Administered 2014-07-11 (×2): 4 mg via INTRAVENOUS
  Filled 2014-07-11 (×2): qty 2

## 2014-07-11 MED ORDER — HEPARIN SODIUM (PORCINE) 5000 UNIT/ML IJ SOLN
5000.0000 [IU] | Freq: Three times a day (TID) | INTRAMUSCULAR | Status: DC
  Administered 2014-07-11 – 2014-07-15 (×12): 5000 [IU] via SUBCUTANEOUS
  Filled 2014-07-11 (×14): qty 1

## 2014-07-11 MED ORDER — AMLODIPINE BESYLATE 5 MG PO TABS
5.0000 mg | ORAL_TABLET | Freq: Every day | ORAL | Status: DC
  Administered 2014-07-11: 5 mg via ORAL
  Filled 2014-07-11 (×2): qty 1

## 2014-07-11 MED ORDER — LISINOPRIL 20 MG PO TABS
20.0000 mg | ORAL_TABLET | Freq: Every day | ORAL | Status: DC
  Administered 2014-07-12 – 2014-07-15 (×3): 20 mg via ORAL
  Filled 2014-07-11 (×4): qty 1

## 2014-07-11 MED ORDER — ALPRAZOLAM 0.25 MG PO TABS
0.2500 mg | ORAL_TABLET | Freq: Three times a day (TID) | ORAL | Status: DC | PRN
  Administered 2014-07-11: 0.25 mg via ORAL
  Filled 2014-07-11: qty 1

## 2014-07-11 NOTE — Progress Notes (Signed)
  Echocardiogram 2D Echocardiogram has been performed.  Thomas Watts 07/11/2014, 3:26 PM

## 2014-07-11 NOTE — ED Notes (Signed)
Pt started having CP at 4am ,with nausea, and SOB,  pt having runs of vtach.

## 2014-07-11 NOTE — ED Notes (Signed)
Cardiology at BS

## 2014-07-11 NOTE — CV Procedure (Signed)
Cardiac Catheterization Procedure Note  Name: Thomas Watts MRN: [id number] DOB: May 21, 1961  Procedure: Left Heart Cath, Selective Coronary Angiography, LV angiography, PTCA and stenting of the proximal to mid RCA  Indication: 53 yo BM with NSTEMI and repetitive nonsustained VT and ongoing chest pain.  Procedural Details:  The right wrist was prepped, draped, and anesthetized with 1% lidocaine. Using the modified Seldinger technique, a 6 French slender sheath was introduced into the right radial artery. 3 mg of verapamil was administered through the sheath, weight-based unfractionated heparin was administered intravenously. Standard Judkins catheters were used for selective coronary angiography and left ventriculography. Catheter exchanges were performed over an exchange length guidewire.  PROCEDURAL FINDINGS Hemodynamics: AO 140/93 mean 115 mm Hg LV 136/7 mm Hg   Coronary angiography: Coronary dominance: right  Left mainstem: Normal  Left anterior descending (LAD): The LAD is a large vessel with diffuse 30% disease in the mid to distal vessel.   There is a small ramus intermediate branch with 80% proximal stenosis.  Left circumflex (LCx): The LCx is a large vessel giving rise to a single large branching OM. There is diffuse stenosis in the mid LCx prior to the OM up to 80%.  Right coronary artery (RCA): The RCA is diffusely diseased in the proximal to mid vessel. There is a focal 95% stenosis in the proximal segment  Left ventriculography: Left ventricular systolic function is normal, LVEF is estimated at 55-65%, there is no significant mitral regurgitation   PCI Note:  Following the diagnostic procedure, the decision was made to proceed with PCI of the RCA.  Weight-based bivalirudin was given for anticoagulation. Brilinta 180 mg was given orally. Once a therapeutic ACT was achieved, a 6 [place] Hockey stick guide catheter was inserted.  A prowater coronary guidewire was used  to cross the lesion. It was difficult to wire the past the crux and the wire was positioned in a side branch. The lesion was predilated with a 2.0 mm balloon.  The lesion was then stented with a 2.5 x 38 mm Promus stent. At this point there was evidence of a focal dissection at the crux. A second Choice PT wire was passed across this area into the distal RCA. We attempted to pass a stent into this region but it would not cross. We predilated the area with a 2.5 mm balloon but still were unable to cross with the stent. We then used a Guideliner catheter and brought this through the first stent over the 2.5 mm balloon. With Guideliner support we were then able to deliver a 2.5 x 32 mm Promus stent to cover the dissection at the crux and overlap with the first stent.  The entire stented segment  was postdilated with a 3.0 mm noncompliant balloon.  Following PCI, there was 0% residual stenosis and TIMI-3 flow. Final angiography confirmed an excellent result. The patient tolerated the procedure well. There were no immediate procedural complications. A TR band was used for radial hemostasis. The patient was transferred to the post catheterization recovery area for further monitoring.  PCI Data: Vessel - RCA/Segment - proximal to mid Percent Stenosis (pre)  95% TIMI-flow 2 Stent 2.5 x 38 and 2.5 x 32 mm Promus stents post dilated to 3.0 mm. Percent Stenosis (post) 0% TIMI-flow (post) 3  Final Conclusions:   1. Severe 2 vessel obstructive CAD 2. Normal LV function 3. Successful stenting of the proximal to mid RCA with overlapping DES.     Recommendations:  DAPT for  at least one year. Aggressive risk factor modification. Would consider bringing the patient back to the lab for stenting of the LCx on Friday.  Shanda Cadotte [place], [place] 07/11/2014, 7:41 AM

## 2014-07-11 NOTE — Care Management Note (Signed)
    Page 1 of 1   07/11/2014     10:15:44 AM CARE MANAGEMENT NOTE 07/11/2014  Patient:  Thomas Watts, Thomas Watts   Account Number:  [id number]  Date Initiated:  07/11/2014  Documentation initiated by:  Elissa Hefty  Subjective/Objective Assessment:   adm w mi     Action/Plan:   lives at home   Anticipated DC Date:     Anticipated DC Plan:  [place]  CM consult  Medication Assistance      Choice offered to / List presented to:             Status of service:   Medicare Important Message given?   (If response is "NO", the following Medicare IM given date fields will be blank) Date Medicare IM given:   Medicare IM given by:   Date Additional Medicare IM given:   Additional Medicare IM given by:    Discharge Disposition:    Per UR Regulation:  Reviewed for med. necessity/level of care/duration of stay  If discussed at [place] of Stay Meetings, dates discussed:    Comments:  10/28 1014 debbie Carlyne Keehan rn,bsn gave pt brilinta 30day free and copay assist card.
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
## 2014-07-11 NOTE — ED Notes (Signed)
Pt c/o left sided chest pain radiating to left arm, shortness of breath. Pain woke patient. Pt describes the pain at pressure and squeezing. Pt reports breaking out into sweats at home, c/o nausea with chest pain.

## 2014-07-11 NOTE — Progress Notes (Signed)
Chaplain responded to code stemi in ED. Chaplain spoke with pt's girlfriend, Netta Corrigan, outside of room, offering emotional support. Chaplain then entered pt room and spoke with pt. Pt said, "I'm scared to death," and chaplain prayed with him. A few minutes later chaplain and Netta Corrigan entered pt room together and pt was calmer as a result of of medication helping alleviate his pain. Giselle encouraged pt to "relax" as he could get "worked up" sometimes. Giselle shared with chaplain that pt is "always anxious." Chaplain provided hospitality and escorted Netta Corrigan to cath lab waiting area. Will refer to spiritual care staff for follow-up.    07/11/14 8270  Clinical Encounter Type  Visited With Patient;Family;Patient and family together  Visit Type Other (Comment);Spiritual support (stemi)  Spiritual Encounters  Spiritual Needs Prayer;Emotional  Stress Factors  Patient Stress Factors Health changes  Family Stress Factors Exhausted   Vanetta Mulders 07/11/2014 6:42 AM

## 2014-07-11 NOTE — Progress Notes (Signed)
07/11/14 1500  Clinical Encounter Type  Visited With Patient;Family  Visit Type Follow-up;Spiritual support  Referral From Nurse  Stress Factors  Patient Stress Factors Health changes  Advance Directives (For Healthcare)  Does patient have an advance directive? No  Would patient like information on creating an advanced directive? Yes - Educational materials given   Chaplain was referred to patient via spiritual care consult. Consult indicated that patient was interested in filling out or updating an advanced directive. Patient was feeling very sick when chaplain visited. Therefore chaplain simply left a blank advanced directive with him and offered further assistance with the document if needed at a later time. Chaplain plans to follow up with the patient at a later time. Coleby Yett, Claudius Sis, Chaplain 3:37 PM

## 2014-07-11 NOTE — ED Provider Notes (Signed)
CSN: [id number]     Arrival date & time 07/11/14  2025 History   First MD Initiated Contact with Patient 07/11/14 0546     Chief Complaint  Patient presents with  . Chest Pain     (Consider location/radiation/quality/duration/timing/severity/associated sxs/prior Treatment) HPI 53 yo male presents to the ER from home with complaint of chest pain, left arm pain starting at 4 am, waking from sleep.  Pt has h/o HTN, prostate resection.  Pt is 1-2 ppd smoker.  Pt has had nausea, diaphoresis.  No prior h/o same.  Some pain radiating into his back.  Pt googled his symptoms as he had left arm numbness Past Medical History  Diagnosis Date  . Hypertension   . Hyperlipemia   . High cholesterol    Past Surgical History  Procedure Laterality Date  . Tracheostomy     Family History  Problem Relation Age of Onset  . Parkinsonism Mother   . Cancer Father   . Cancer Brother    History  Substance Use Topics  . Smoking status: Current Every Day Smoker  . Smokeless tobacco: Not on file  . Alcohol Use: No    Review of Systems  Unable to perform ROS: Acuity of condition      Allergies  Penicillins  Home Medications   Prior to Admission medications   Medication Sig Start Date End Date Taking? Authorizing Provider  acetaminophen-codeine (TYLENOL #3) 300-30 MG per tablet Take 1 tablet by mouth every 6 (six) hours as needed for pain. 06/21/12   Awilda Metro, NP  amLODipine (NORVASC) 10 MG tablet Take 10 mg by mouth daily.      Historical Provider, MD  cyclobenzaprine (FLEXERIL) 10 MG tablet Take 1 tablet (10 mg total) by mouth 2 (two) times daily. Do not take if driving. 06/21/12   Awilda Metro, NP  enalapril (VASOTEC) 10 MG tablet Take 10 mg by mouth daily.    Historical Provider, MD  simvastatin (ZOCOR) 10 MG tablet Take 10 mg by mouth at bedtime.      Historical Provider, MD   BP 163/136  Pulse 96  Resp 18  Ht 5\' 11"  (1.803 m)  Wt 205 lb (92.987 kg)  BMI 28.60 kg/m2  SpO2  100% Physical Exam  Nursing note and vitals reviewed. Constitutional: He is oriented to person, place, and time. He appears well-developed and well-nourished. He appears distressed.  HENT:  Head: Normocephalic and atraumatic.  Nose: Nose normal.  Mouth/Throat: Oropharynx is clear and moist.  Eyes: Conjunctivae and EOM are normal. Pupils are equal, round, and reactive to light.  Neck: Normal range of motion. Neck supple. No JVD present. No tracheal deviation present. No thyromegaly present.  Cardiovascular: Normal rate, regular rhythm, normal heart sounds and intact distal pulses.  Exam reveals no gallop and no friction rub.   No murmur heard. Pulmonary/Chest: Effort normal and breath sounds normal. No stridor. No respiratory distress. He has no wheezes. He has no rales. He exhibits no tenderness.  Abdominal: Soft. Bowel sounds are normal. He exhibits no distension and no mass. There is no tenderness. There is no rebound and no guarding.  Musculoskeletal: Normal range of motion. He exhibits no edema and no tenderness.  Lymphadenopathy:    He has no cervical adenopathy.  Neurological: He is alert and oriented to person, place, and time. He displays normal reflexes. He exhibits normal muscle tone. Coordination normal.  Skin: Skin is warm. No rash noted. He is diaphoretic. No erythema. No pallor.  Psychiatric: He has a normal mood and affect. His behavior is normal. Judgment and thought content normal.    ED Course  Procedures (including critical care time) [place], ED - Abnormal; Notable for the following:    Troponin i, poc 0.86 (*)    All other components within normal limits  CBC  BASIC METABOLIC PANEL  PRO B NATRIURETIC PEPTIDE  HEPARIN LEVEL (UNFRACTIONATED)    Imaging Review No results found.   EKG Interpretation   Date/Time:  Wednesday July 11 2014 05:29:22 EDT Ventricular Rate:  93 PR Interval:  154 QRS Duration: 74 QT Interval:   356 QTC Calculation: 442 R Axis:   78 Text Interpretation:  Sinus rhythm frequent pvc Septal infarct , age  undetermined Marked ST abnormality, possible anterior subendocardial  injury Abnormal ECG Confirmed by Marsha Gundlach  MD, Haani Bakula ([id number]) on 07/11/2014  6:05:31 AM       EKG Interpretation  Date/Time:  Wednesday July 11 2014 05:30:28 EDT Ventricular Rate:  93 PR Interval:  154 QRS Duration: 138 QT Interval:  394 QTC Calculation: 489 R Axis:   -99 Text Interpretation:  Undetermined rhythm Right bundle branch block Septal infarct , age undetermined Abnormal ECG Confirmed by Laticha Ferrucci  MD, Marriah Sanderlin ([id number]) on 07/11/2014 6:06:13 AM        EKG Interpretation  Date/Time:  Wednesday July 11 2014 05:37:31 EDT Ventricular Rate:  93 PR Interval:  160 QRS Duration: 86 QT Interval:  349 QTC Calculation: 434 R Axis:   84 Text Interpretation:  Sinus rhythm LAE, consider biatrial enlargement Posterior infarct, acute (LCx) Anteroseptal infarct, age indeterminate 4 ** ACUTE MI / STEMI ** ** Confirmed by Albi Rappaport  MD, Lochlann Mastrangelo ([id number]) on 07/11/2014 6:07:19 AM       EKG Interpretation  Date/Time:  Wednesday July 11 2014 05:38:02 EDT Ventricular Rate:  97 PR Interval:  160 QRS Duration: 152 QT Interval:  415 QTC Calculation: 527 R Axis:   -85 Text Interpretation:  Ventricular tachycardia (ventricular or supraventricular with aberration) Right bundle branch block Left ventricular hypertrophy Inferior infarct, acute (LCx) Lateral leads are also involved Confirmed by Zaccary Creech  MD, Layani Foronda ([id number]) on 07/11/2014 6:08:11 AM       EKG Interpretation  Date/Time:  Wednesday July 11 2014 05:49:57 EDT Ventricular Rate:  111 PR Interval:  150 QRS Duration: 91 QT Interval:  378 QTC Calculation: 514 R Axis:   81 Text Interpretation:  Sinus tachycardia Paired ventricular premature complexes Probable left atrial enlargement Anteroseptal infarct, age indeterminate Lateral leads are also involved posterior  EKG Confirmed by Davelle Anselmi  MD, Deisy Ozbun ([id number]) on 07/11/2014 6:08:59 AM      CRITICAL CARE Performed by: Kalman Drape Total critical care time: 60 min Critical care time was exclusive of separately billable procedures and treating other patients. Critical care was necessary to treat or prevent imminent or life-threatening deterioration. Critical care was time spent personally by me on the following activities: development of treatment plan with patient and/or surrogate as well as nursing, discussions with consultants, evaluation of patient's response to treatment, examination of patient, obtaining history from patient or surrogate, ordering and performing treatments and interventions, ordering and review of laboratory studies, ordering and review of radiographic studies, pulse oximetry and re-evaluation of patient's condition.  MDM   Final diagnoses:  ST elevation myocardial infarction (STEMI), unspecified artery   53 yo male with chest pain, frequent vtach, concern for posterior MI.  Code STEMI called.  Dr Martinique consulted, will take to  cath lab.    Kalman Drape, MD 07/11/14 [phone]

## 2014-07-11 NOTE — Progress Notes (Signed)
ANTICOAGULATION CONSULT NOTE - Initial Consult  Pharmacy Consult for heparin Indication: chest pain/ACS  Allergies  Allergen Reactions  . Penicillins Hives    Patient Measurements: Height: 5\' 11"  (180.3 cm) Weight: 205 lb (92.987 kg) IBW/kg (Calculated) : 75.3  Vital Signs: BP: 163/136 mmHg (10/28 0536) Pulse Rate: 96 (10/28 0536)   Medical History: Past Medical History  Diagnosis Date  . Hypertension   . Hyperlipemia   . High cholesterol     Assessment: 53yo male c/o left-sided CP radiating to left arm associated w/ SOB, having runs of Vtach in ED, initial troponin elevated, to begin heparin.  Goal of Therapy:  Heparin level 0.3-0.7 units/ml Monitor platelets by anticoagulation protocol: Yes   Plan:  Will give heparin 4000 units x1 followed by gtt at 1100 units/hr and monitor heparin levels and CBC.  Wynona Neat, PharmD, BCPS  07/11/2014,5:53 AM

## 2014-07-12 LAB — GLUCOSE, CAPILLARY
GLUCOSE-CAPILLARY: 193 mg/dL — AB (ref 70–99)
Glucose-Capillary: 183 mg/dL — ABNORMAL HIGH (ref 70–99)
Glucose-Capillary: 143 mg/dL — ABNORMAL HIGH (ref 70–99)
Glucose-Capillary: 132 mg/dL — ABNORMAL HIGH (ref 70–99)

## 2014-07-12 LAB — COMPREHENSIVE METABOLIC PANEL
ALK PHOS: 81 U/L (ref 39–117)
ALT: 65 U/L — ABNORMAL HIGH (ref 0–53)
ANION GAP: 18 — AB (ref 5–15)
AST: 222 U/L — ABNORMAL HIGH (ref 0–37)
Albumin: 3.9 g/dL (ref 3.5–5.2)
BILIRUBIN TOTAL: 0.7 mg/dL (ref 0.3–1.2)
BUN: 10 mg/dL (ref 6–23)
CHLORIDE: 99 meq/L (ref 96–112)
CO2: 20 mEq/L (ref 19–32)
CREATININE: 0.89 mg/dL (ref 0.50–1.35)
Calcium: 9.5 mg/dL (ref 8.4–10.5)
GFR calc non Af Amer: >90 mL/min (ref >90)
Glucose, Bld: 196 mg/dL — ABNORMAL HIGH (ref 70–99)
Potassium: 3.8 mEq/L (ref 3.7–5.3)
Sodium: 137 mEq/L (ref 137–147)
TOTAL PROTEIN: 7.9 g/dL (ref 6.0–8.3)

## 2014-07-12 LAB — CBC
HEMATOCRIT: 43.9 % (ref 39.0–52.0)
HEMOGLOBIN: 15.6 g/dL (ref 13.0–17.0)
MCH: 30.5 pg (ref 26.0–34.0)
MCHC: 35.5 g/dL (ref 30.0–36.0)
MCV: 85.9 fL (ref 78.0–100.0)
Platelets: 245 10*3/uL (ref 150–400)
RBC: 5.11 MIL/uL (ref 4.22–5.81)
RDW: 14.6 % (ref 11.5–15.5)
WBC: 9.2 10*3/uL (ref 4.0–10.5)

## 2014-07-12 LAB — LIPID PANEL
CHOLESTEROL: 312 mg/dL — AB (ref 0–200)
HDL: 47 mg/dL (ref >39)
LDL Cholesterol: 237 mg/dL — ABNORMAL HIGH (ref 0–99)
Total CHOL/HDL Ratio: 6.6 RATIO
Triglycerides: 138 mg/dL (ref <150)
VLDL: 28 mg/dL (ref 0–40)

## 2014-07-12 LAB — MAGNESIUM: Magnesium: 2 mg/dL (ref 1.5–2.5)

## 2014-07-12 MED ORDER — SODIUM CHLORIDE 0.9 % IV SOLN: 250.0000 mL | INTRAVENOUS | Status: DC | PRN

## 2014-07-12 MED ORDER — SODIUM CHLORIDE 0.9 % IJ SOLN
3.0000 mL | Freq: Two times a day (BID) | INTRAMUSCULAR | Status: DC
  Administered 2014-07-12 – 2014-07-13 (×2): 3 mL via INTRAVENOUS

## 2014-07-12 MED ORDER — SODIUM CHLORIDE 0.9 % IJ SOLN: 3.0000 mL | INTRAMUSCULAR | Status: DC | PRN

## 2014-07-12 MED ORDER — POTASSIUM CHLORIDE CRYS ER 20 MEQ PO TBCR
40.0000 meq | EXTENDED_RELEASE_TABLET | Freq: Once | ORAL | Status: AC
  Administered 2014-07-12: 40 meq via ORAL
  Filled 2014-07-12 (×2): qty 2

## 2014-07-12 MED ORDER — AMLODIPINE BESYLATE 10 MG PO TABS
10.0000 mg | ORAL_TABLET | Freq: Every day | ORAL | Status: DC
Start: 2014-07-12 — End: 2014-07-15
  Administered 2014-07-12 – 2014-07-15 (×3): 10 mg via ORAL
  Filled 2014-07-12 (×4): qty 1

## 2014-07-12 MED ORDER — METOPROLOL TARTRATE 25 MG PO TABS
25.0000 mg | ORAL_TABLET | Freq: Three times a day (TID) | ORAL | Status: DC
  Administered 2014-07-12 – 2014-07-15 (×9): 25 mg via ORAL
  Filled 2014-07-12 (×12): qty 1

## 2014-07-12 MED ORDER — ASPIRIN 81 MG PO CHEW
81.0000 mg | CHEWABLE_TABLET | ORAL | Status: DC
Start: 2014-07-13 — End: 2014-07-12

## 2014-07-12 MED FILL — Sodium Chloride IV Soln 0.9%: INTRAVENOUS | Qty: 50 | Status: AC

## 2014-07-12 NOTE — Progress Notes (Signed)
[id number] Cardiac Rehab On arrival pt in bed asking to get out of bed. Assisted pt to recliner. HR 120's with activity. Pt states being in the chair felt so good. Pt not ready for ambulation today with plan for PCI tomorrow. Started MI and stent education with pt. I gave him MI and stent booklets. We discussed smoking cessation. I gave him tips for quitting, coaching contact number and information on quit smart classes. Pt is very eager to make lifestyle changes and to quit smoking. We discussed Outpt. CRP, he agrees to referral to referral to [facility]. We will continue to follow pt. Deon Pilling, RN 07/12/2014 3:30 PM

## 2014-07-12 NOTE — Progress Notes (Signed)
Subjective:  No CP/SOB, C/O HA  Objective:  Temp:  [98 F (36.7 C)-99.1 F (37.3 C)] 98.5 F (36.9 C) (10/29 0753) Pulse Rate:  [67-120] 111 (10/29 0753) Resp:  [7-31] 24 (10/29 0753) BP: (136-182)/(68-129) 164/95 mmHg (10/29 0753) SpO2:  [95 %-100 %] 99 % (10/29 0753) Weight:  [211 lb 3.2 oz (95.8 kg)] 211 lb 3.2 oz (95.8 kg) (10/29 0300) Weight change: 6 lb 3.2 oz (2.813 kg)  Intake/Output from previous day: 10/28 0701 - 10/29 0700 In: 1408.1 [P.O.:360; I.V.:1048.1] Out: 1450 [Urine:1450]  Intake/Output from this shift:    Physical Exam: General appearance: alert and no distress Neck: no adenopathy, no carotid bruit, no JVD, supple, symmetrical, trachea midline and thyroid not enlarged, symmetric, no tenderness/mass/nodules Lungs: clear to auscultation bilaterally Heart: regular rate and rhythm, S1, S2 normal, no murmur, click, rub or gallop Extremities: extremities normal, atraumatic, no cyanosis or edema  Lab Results: Results for orders placed during the hospital encounter of 07/11/14 (from the past 48 hour(s))  BASIC METABOLIC PANEL     Status: Abnormal   Collection Time    07/11/14  5:40 AM      Result Value Ref Range   Sodium 139  137 - 147 mEq/L   Potassium 3.8  3.7 - 5.3 mEq/L   Chloride 98  96 - 112 mEq/L   CO2 23  19 - 32 mEq/L   Glucose, Bld 200 (*) 70 - 99 mg/dL   BUN 17  6 - 23 mg/dL   Creatinine, Ser 1.22  0.50 - 1.35 mg/dL   Calcium 10.2  8.4 - 10.5 mg/dL   GFR calc non Af Amer 67 (*) >90 mL/min   GFR calc Af Amer 77 (*) >90 mL/min   Comment: (NOTE)     The eGFR has been calculated using the CKD EPI equation.     This calculation has not been validated in all clinical situations.     eGFR's persistently <90 mL/min signify possible Chronic Kidney     Disease.   Anion gap 18 (*) 5 - 15  PRO B NATRIURETIC PEPTIDE     Status: None   Collection Time    07/11/14  5:40 AM      Result Value Ref Range   Pro B Natriuretic peptide (BNP) 13.9  0 -  125 pg/mL  CBC     Status: None   Collection Time    07/11/14  5:40 AM      Result Value Ref Range   WBC 9.5  4.0 - 10.5 K/uL   RBC 5.28  4.22 - 5.81 MIL/uL   Hemoglobin 16.2  13.0 - 17.0 g/dL   HCT 46.2  39.0 - 52.0 %   MCV 87.5  78.0 - 100.0 fL   MCH 30.7  26.0 - 34.0 pg   MCHC 35.1  30.0 - 36.0 g/dL   RDW 14.4  11.5 - 15.5 %   Platelets 295  150 - 400 K/uL  I-STAT TROPOININ, ED     Status: Abnormal   Collection Time    07/11/14  5:44 AM      Result Value Ref Range   Troponin i, poc 0.86 (*) 0.00 - 0.08 ng/mL   Comment NOTIFIED PHYSICIAN     Comment 3            Comment: Due to the release kinetics of cTnI,     a negative result within the first hours     of  the onset of symptoms does not rule out     myocardial infarction with certainty.     If myocardial infarction is still suspected,     repeat the test at appropriate intervals.  POCT ACTIVATED CLOTTING TIME     Status: None   Collection Time    07/11/14  6:57 AM      Result Value Ref Range   Activated Clotting Time 484    MRSA PCR SCREENING     Status: None   Collection Time    07/11/14  8:32 AM      Result Value Ref Range   MRSA by PCR NEGATIVE  NEGATIVE   Comment:            The GeneXpert MRSA Assay (FDA     approved for NASAL specimens     only), is one component of a     comprehensive MRSA colonization     surveillance program. It is not     intended to diagnose MRSA     infection nor to guide or     monitor treatment for     MRSA infections.  TSH     Status: None   Collection Time    07/11/14  9:00 AM      Result Value Ref Range   TSH 1.260  0.350 - 4.500 uIU/mL  HEMOGLOBIN A1C     Status: Abnormal   Collection Time    07/11/14  9:20 AM      Result Value Ref Range   Hemoglobin A1C 7.3 (*) <5.7 %   Comment: (NOTE)                                                                               According to the ADA Clinical Practice Recommendations for 2011, when     HbA1c is used as a screening test:       >=6.5%   Diagnostic of Diabetes Mellitus               (if abnormal result is confirmed)     5.7-6.4%   Increased risk of developing Diabetes Mellitus     References:Diagnosis and Classification of Diabetes Mellitus,Diabetes     FXOV,2919,16(OMAYO 1):S62-S69 and Standards of Medical Care in             Diabetes - 2011,Diabetes Care,2011,34 (Suppl 1):S11-S61.   Mean Plasma Glucose 163 (*) <117 mg/dL   Comment: Performed at [facility]  TROPONIN I     Status: Abnormal   Collection Time    07/11/14  9:20 AM      Result Value Ref Range   Troponin I >20.00 (*) <0.30 ng/mL   Comment:            Due to the release kinetics of cTnI,     a negative result within the first hours     of the onset of symptoms does not rule out     myocardial infarction with certainty.     If myocardial infarction is still suspected,     repeat the test at appropriate intervals.     HEMOLYZED SPECIMEN, RESULTS MAY BE AFFECTED     CRITICAL  RESULT CALLED TO, READ BACK BY AND VERIFIED WITH:     M.NORMENT,RN 1050 07/11/14 CLARK,S  CBC     Status: None   Collection Time    07/11/14  9:20 AM      Result Value Ref Range   WBC 9.6  4.0 - 10.5 K/uL   RBC 5.18  4.22 - 5.81 MIL/uL   Hemoglobin 15.6  13.0 - 17.0 g/dL   HCT 44.3  39.0 - 52.0 %   MCV 85.5  78.0 - 100.0 fL   MCH 30.1  26.0 - 34.0 pg   MCHC 35.2  30.0 - 36.0 g/dL   RDW 14.4  11.5 - 15.5 %   Platelets 309  150 - 400 K/uL  CREATININE, SERUM     Status: None   Collection Time    07/11/14  9:20 AM      Result Value Ref Range   Creatinine, Ser 0.94  0.50 - 1.35 mg/dL   GFR calc non Af Amer >90  >90 mL/min   GFR calc Af Amer >90  >90 mL/min   Comment: (NOTE)     The eGFR has been calculated using the CKD EPI equation.     This calculation has not been validated in all clinical situations.     eGFR's persistently <90 mL/min signify possible Chronic Kidney     Disease.  GLUCOSE, CAPILLARY     Status: Abnormal   Collection Time    07/11/14   1:28 PM      Result Value Ref Range   Glucose-Capillary 163 (*) 70 - 99 mg/dL   Comment 1 Capillary Sample    TROPONIN I     Status: Abnormal   Collection Time    07/11/14  2:38 PM      Result Value Ref Range   Troponin I >20.00 (*) <0.30 ng/mL   Comment:            Due to the release kinetics of cTnI,     a negative result within the first hours     of the onset of symptoms does not rule out     myocardial infarction with certainty.     If myocardial infarction is still suspected,     repeat the test at appropriate intervals.     CRITICAL VALUE NOTED.  VALUE IS CONSISTENT WITH PREVIOUSLY REPORTED AND CALLED VALUE.  GLUCOSE, CAPILLARY     Status: Abnormal   Collection Time    07/11/14  4:11 PM      Result Value Ref Range   Glucose-Capillary 190 (*) 70 - 99 mg/dL   Comment 1 Capillary Sample    TROPONIN I     Status: Abnormal   Collection Time    07/11/14  8:28 PM      Result Value Ref Range   Troponin I >20.00 (*) <0.30 ng/mL   Comment:            Due to the release kinetics of cTnI,     a negative result within the first hours     of the onset of symptoms does not rule out     myocardial infarction with certainty.     If myocardial infarction is still suspected,     repeat the test at appropriate intervals.     CRITICAL VALUE NOTED.  VALUE IS CONSISTENT WITH PREVIOUSLY REPORTED AND CALLED VALUE.  GLUCOSE, CAPILLARY     Status: Abnormal   Collection Time    07/11/14  9:13 PM      Result Value Ref Range   Glucose-Capillary 190 (*) 70 - 99 mg/dL   Comment 1 Capillary Sample    CBC     Status: None   Collection Time    07/12/14  3:30 AM      Result Value Ref Range   WBC 9.2  4.0 - 10.5 K/uL   RBC 5.11  4.22 - 5.81 MIL/uL   Hemoglobin 15.6  13.0 - 17.0 g/dL   HCT 43.9  39.0 - 52.0 %   MCV 85.9  78.0 - 100.0 fL   MCH 30.5  26.0 - 34.0 pg   MCHC 35.5  30.0 - 36.0 g/dL   RDW 14.6  11.5 - 15.5 %   Platelets 245  150 - 400 K/uL  COMPREHENSIVE METABOLIC PANEL     Status:  Abnormal   Collection Time    07/12/14  3:30 AM      Result Value Ref Range   Sodium 137  137 - 147 mEq/L   Potassium 3.8  3.7 - 5.3 mEq/L   Chloride 99  96 - 112 mEq/L   CO2 20  19 - 32 mEq/L   Glucose, Bld 196 (*) 70 - 99 mg/dL   BUN 10  6 - 23 mg/dL   Creatinine, Ser 0.89  0.50 - 1.35 mg/dL   Calcium 9.5  8.4 - 10.5 mg/dL   Total Protein 7.9  6.0 - 8.3 g/dL   Albumin 3.9  3.5 - 5.2 g/dL   AST 222 (*) 0 - 37 U/L   ALT 65 (*) 0 - 53 U/L   Alkaline Phosphatase 81  39 - 117 U/L   Total Bilirubin 0.7  0.3 - 1.2 mg/dL   GFR calc non Af Amer >90  >90 mL/min   GFR calc Af Amer >90  >90 mL/min   Comment: (NOTE)     The eGFR has been calculated using the CKD EPI equation.     This calculation has not been validated in all clinical situations.     eGFR's persistently <90 mL/min signify possible Chronic Kidney     Disease.   Anion gap 18 (*) 5 - 15  LIPID PANEL     Status: Abnormal   Collection Time    07/12/14  3:30 AM      Result Value Ref Range   Cholesterol 312 (*) 0 - 200 mg/dL   Triglycerides 138  <150 mg/dL   HDL 47  >39 mg/dL   Total CHOL/HDL Ratio 6.6     VLDL 28  0 - 40 mg/dL   LDL Cholesterol 237 (*) 0 - 99 mg/dL   Comment:            Total Cholesterol/HDL:CHD Risk     Coronary Heart Disease Risk Table                         Men   Women      1/2 Average Risk   3.4   3.3      Average Risk       5.0   4.4      2 X Average Risk   9.6   7.1      3 X Average Risk  23.4   11.0                Use the calculated Patient Ratio     above and the CHD Risk  Table     to determine the patient's CHD Risk.                ATP III CLASSIFICATION (LDL):      <100     mg/dL   Optimal      100-129  mg/dL   Near or Above                        Optimal      130-159  mg/dL   Borderline      160-189  mg/dL   High      >190     mg/dL   Very High    Imaging: Imaging results have been reviewed  Tele: SR/ST, NSVT   Assessment/Plan:   1. Active Problems: 2.   Diabetes mellitus  type 2 in obese 3.   Prostate ca 4.   Hyperlipemia 5.   Hypertension 6.   Acute ST elevation myocardial infarction (STEMI) 7. NSVT  Time Spent Directly with Patient:  20 minutes  Length of Stay:  LOS: 1 day   1. STEMI- S/P RCA PCI/Stent with DES on DAPT. Excellent angiographic result by Dr. Shirlee More. Good LV fxn. Residual LCX Dz. Plan for staged LCX intervention tomorrow.  2. HTN- Better on IV NTG and oral meds. Tachycardic. Will advance BB, adjust CCB. Wean IV NTG as tolerated. Currently denies CP  3. NSVT- Had 10 beat run of ASx NSVT. K 3.8. Will check Mg. Adjust BB. QTc 593. Will D/C promethazine and Zofran.   Lorretta Harp 07/12/2014, 8:21 AM

## 2014-07-13 ENCOUNTER — Encounter (HOSPITAL_COMMUNITY)
Admission: EM | Disposition: A | Discharge status: Home / Self Care | Payer: BC Managed Care – PPO | Source: Home / Self Care | Attending: Cardiology

## 2014-07-13 DIAGNOSIS — I2510 Atherosclerotic heart disease of native coronary artery without angina pectoris: Secondary | ICD-10-CM

## 2014-07-13 HISTORY — PX: PERCUTANEOUS CORONARY STENT INTERVENTION (PCI-S): SHX5485

## 2014-07-13 LAB — BASIC METABOLIC PANEL
Anion gap: 15 (ref 5–15)
BUN: 14 mg/dL (ref 6–23)
CO2: 22 mEq/L (ref 19–32)
CREATININE: 0.97 mg/dL (ref 0.50–1.35)
Calcium: 9.5 mg/dL (ref 8.4–10.5)
Chloride: 102 mEq/L (ref 96–112)
GFR calc Af Amer: >90 mL/min (ref >90)
GFR calc non Af Amer: >90 mL/min (ref >90)
Glucose, Bld: 154 mg/dL — ABNORMAL HIGH (ref 70–99)
Potassium: 3.8 mEq/L (ref 3.7–5.3)
Sodium: 139 mEq/L (ref 137–147)

## 2014-07-13 LAB — CBC
HCT: 45.8 % (ref 39.0–52.0)
Hemoglobin: 15.7 g/dL (ref 13.0–17.0)
MCH: 29.6 pg (ref 26.0–34.0)
MCHC: 34.3 g/dL (ref 30.0–36.0)
MCV: 86.3 fL (ref 78.0–100.0)
Platelets: 272 10*3/uL (ref 150–400)
RBC: 5.31 MIL/uL (ref 4.22–5.81)
RDW: 14.9 % (ref 11.5–15.5)
WBC: 7.7 10*3/uL (ref 4.0–10.5)

## 2014-07-13 LAB — GLUCOSE, CAPILLARY
Glucose-Capillary: 150 mg/dL — ABNORMAL HIGH (ref 70–99)
Glucose-Capillary: 126 mg/dL — ABNORMAL HIGH (ref 70–99)
Glucose-Capillary: 189 mg/dL — ABNORMAL HIGH (ref 70–99)
Glucose-Capillary: 150 mg/dL — ABNORMAL HIGH (ref 70–99)

## 2014-07-13 LAB — POCT ACTIVATED CLOTTING TIME: ACTIVATED CLOTTING TIME: 535 s

## 2014-07-13 LAB — PROTIME-INR
INR: 1.05 (ref 0.00–1.49)
Prothrombin Time: 13.8 seconds (ref 11.6–15.2)

## 2014-07-13 SURGERY — PERCUTANEOUS CORONARY STENT INTERVENTION (PCI-S)
Anesthesia: LOCAL

## 2014-07-13 MED ORDER — HEPARIN SODIUM (PORCINE) 1000 UNIT/ML IJ SOLN
INTRAMUSCULAR | Status: AC
  Filled 2014-07-13: qty 1

## 2014-07-13 MED ORDER — TICAGRELOR 90 MG PO TABS: 90.0000 mg | ORAL_TABLET | Freq: Two times a day (BID) | ORAL | Status: DC

## 2014-07-13 MED ORDER — LIDOCAINE HCL (PF) 1 % IJ SOLN
INTRAMUSCULAR | Status: AC
  Filled 2014-07-13: qty 30

## 2014-07-13 MED ORDER — ONDANSETRON HCL 4 MG/2ML IJ SOLN: 4.0000 mg | Freq: Four times a day (QID) | INTRAMUSCULAR | Status: DC | PRN

## 2014-07-13 MED ORDER — FENTANYL CITRATE 0.05 MG/ML IJ SOLN
INTRAMUSCULAR | Status: AC
  Filled 2014-07-13: qty 2

## 2014-07-13 MED ORDER — VERAPAMIL HCL 2.5 MG/ML IV SOLN
INTRAVENOUS | Status: AC
  Filled 2014-07-13: qty 2

## 2014-07-13 MED ORDER — BIVALIRUDIN 250 MG IV SOLR
INTRAVENOUS | Status: AC
  Filled 2014-07-13: qty 250

## 2014-07-13 MED ORDER — HEPARIN (PORCINE) IN NACL 2-0.9 UNIT/ML-% IJ SOLN
INTRAMUSCULAR | Status: AC
  Filled 2014-07-13: qty 1500

## 2014-07-13 MED ORDER — TICAGRELOR 90 MG PO TABS
ORAL_TABLET | ORAL | Status: AC
  Filled 2014-07-13: qty 1

## 2014-07-13 MED ORDER — ACETAMINOPHEN 325 MG PO TABS: 650.0000 mg | ORAL_TABLET | ORAL | Status: DC | PRN

## 2014-07-13 MED ORDER — SODIUM CHLORIDE 0.9 % IV SOLN
INTRAVENOUS | Status: DC
  Administered 2014-07-13 (×2): via INTRAVENOUS

## 2014-07-13 MED ORDER — ASPIRIN EC 81 MG PO TBEC
81.0000 mg | DELAYED_RELEASE_TABLET | Freq: Every day | ORAL | Status: DC
Start: 2014-07-13 — End: 2014-07-15
  Administered 2014-07-14 – 2014-07-15 (×2): 81 mg via ORAL
  Filled 2014-07-13 (×3): qty 1

## 2014-07-13 MED ORDER — MIDAZOLAM HCL 2 MG/2ML IJ SOLN
INTRAMUSCULAR | Status: AC
  Filled 2014-07-13: qty 2

## 2014-07-13 MED ORDER — NITROGLYCERIN 1 MG/10 ML FOR IR/CATH LAB
INTRA_ARTERIAL | Status: AC
  Filled 2014-07-13: qty 10

## 2014-07-13 NOTE — H&P (View-Only) (Signed)
Subjective:  No CP/SOB, POD #2 Inf STEMI Rx with PCI/Stent  Objective:  Temp:  [97.8 F (36.6 C)-98.7 F (37.1 C)] 98.4 F (36.9 C) (10/30 0725) Pulse Rate:  [87-114] 100 (10/30 0700) Resp:  [17-27] 22 (10/30 0700) BP: (107-169)/(69-127) 144/99 mmHg (10/30 0700) SpO2:  [93 %-100 %] 97 % (10/30 0700) Weight:  [205 lb 7.5 oz (93.2 kg)] 205 lb 7.5 oz (93.2 kg) (10/30 0300) Weight change: -5 lb 11.7 oz (-2.6 kg)  Intake/Output from previous day: 10/29 0701 - 10/30 0700 In: 50 [I.V.:50] Out: 600 [Urine:600]  Intake/Output from this shift:    Physical Exam: General appearance: alert and no distress Neck: no adenopathy, no carotid bruit, no JVD, supple, symmetrical, trachea midline and thyroid not enlarged, symmetric, no tenderness/mass/nodules Lungs: clear to auscultation bilaterally Heart: regular rate and rhythm, S1, S2 normal, no murmur, click, rub or gallop Extremities: extremities normal, atraumatic, no cyanosis or edema  Lab Results: Results for orders placed during the hospital encounter of 07/11/14 (from the past 48 hour(s))  MRSA PCR SCREENING     Status: None   Collection Time    07/11/14  8:32 AM      Result Value Ref Range   MRSA by PCR NEGATIVE  NEGATIVE   Comment:            The GeneXpert MRSA Assay (FDA     approved for NASAL specimens     only), is one component of a     comprehensive MRSA colonization     surveillance program. It is not     intended to diagnose MRSA     infection nor to guide or     monitor treatment for     MRSA infections.  TSH     Status: None   Collection Time    07/11/14  9:00 AM      Result Value Ref Range   TSH 1.260  0.350 - 4.500 uIU/mL  HEMOGLOBIN A1C     Status: Abnormal   Collection Time    07/11/14  9:20 AM      Result Value Ref Range   Hemoglobin A1C 7.3 (*) <5.7 %   Comment: (NOTE)                                                                               According to the ADA Clinical Practice  Recommendations for 2011, when     HbA1c is used as a screening test:      >=6.5%   Diagnostic of Diabetes Mellitus               (if abnormal result is confirmed)     5.7-6.4%   Increased risk of developing Diabetes Mellitus     References:Diagnosis and Classification of Diabetes Mellitus,Diabetes     KPTW,6568,12(XNTZG 1):S62-S69 and Standards of Medical Care in             Diabetes - 2011,Diabetes Care,2011,34 (Suppl 1):S11-S61.   Mean Plasma Glucose 163 (*) <117 mg/dL   Comment: Performed at [facility]  TROPONIN I     Status: Abnormal   Collection Time    07/11/14  9:20 AM  Result Value Ref Range   Troponin I >20.00 (*) <0.30 ng/mL   Comment:            Due to the release kinetics of cTnI,     a negative result within the first hours     of the onset of symptoms does not rule out     myocardial infarction with certainty.     If myocardial infarction is still suspected,     repeat the test at appropriate intervals.     HEMOLYZED SPECIMEN, RESULTS MAY BE AFFECTED     CRITICAL RESULT CALLED TO, READ BACK BY AND VERIFIED WITH:     M.NORMENT,RN 1050 07/11/14 CLARK,S  CBC     Status: None   Collection Time    07/11/14  9:20 AM      Result Value Ref Range   WBC 9.6  4.0 - 10.5 K/uL   RBC 5.18  4.22 - 5.81 MIL/uL   Hemoglobin 15.6  13.0 - 17.0 g/dL   HCT 44.3  39.0 - 52.0 %   MCV 85.5  78.0 - 100.0 fL   MCH 30.1  26.0 - 34.0 pg   MCHC 35.2  30.0 - 36.0 g/dL   RDW 14.4  11.5 - 15.5 %   Platelets 309  150 - 400 K/uL  CREATININE, SERUM     Status: None   Collection Time    07/11/14  9:20 AM      Result Value Ref Range   Creatinine, Ser 0.94  0.50 - 1.35 mg/dL   GFR calc non Af Amer >90  >90 mL/min   GFR calc Af Amer >90  >90 mL/min   Comment: (NOTE)     The eGFR has been calculated using the CKD EPI equation.     This calculation has not been validated in all clinical situations.     eGFR's persistently <90 mL/min signify possible Chronic Kidney     Disease.    GLUCOSE, CAPILLARY     Status: Abnormal   Collection Time    07/11/14  1:28 PM      Result Value Ref Range   Glucose-Capillary 163 (*) 70 - 99 mg/dL   Comment 1 Capillary Sample    TROPONIN I     Status: Abnormal   Collection Time    07/11/14  2:38 PM      Result Value Ref Range   Troponin I >20.00 (*) <0.30 ng/mL   Comment:            Due to the release kinetics of cTnI,     a negative result within the first hours     of the onset of symptoms does not rule out     myocardial infarction with certainty.     If myocardial infarction is still suspected,     repeat the test at appropriate intervals.     CRITICAL VALUE NOTED.  VALUE IS CONSISTENT WITH PREVIOUSLY REPORTED AND CALLED VALUE.  GLUCOSE, CAPILLARY     Status: Abnormal   Collection Time    07/11/14  4:11 PM      Result Value Ref Range   Glucose-Capillary 190 (*) 70 - 99 mg/dL   Comment 1 Capillary Sample    TROPONIN I     Status: Abnormal   Collection Time    07/11/14  8:28 PM      Result Value Ref Range   Troponin I >20.00 (*) <0.30 ng/mL   Comment:  Due to the release kinetics of cTnI,     a negative result within the first hours     of the onset of symptoms does not rule out     myocardial infarction with certainty.     If myocardial infarction is still suspected,     repeat the test at appropriate intervals.     CRITICAL VALUE NOTED.  VALUE IS CONSISTENT WITH PREVIOUSLY REPORTED AND CALLED VALUE.  GLUCOSE, CAPILLARY     Status: Abnormal   Collection Time    07/11/14  9:13 PM      Result Value Ref Range   Glucose-Capillary 190 (*) 70 - 99 mg/dL   Comment 1 Capillary Sample    CBC     Status: None   Collection Time    07/12/14  3:30 AM      Result Value Ref Range   WBC 9.2  4.0 - 10.5 K/uL   RBC 5.11  4.22 - 5.81 MIL/uL   Hemoglobin 15.6  13.0 - 17.0 g/dL   HCT 43.9  39.0 - 52.0 %   MCV 85.9  78.0 - 100.0 fL   MCH 30.5  26.0 - 34.0 pg   MCHC 35.5  30.0 - 36.0 g/dL   RDW 14.6  11.5 - 15.5 %    Platelets 245  150 - 400 K/uL  COMPREHENSIVE METABOLIC PANEL     Status: Abnormal   Collection Time    07/12/14  3:30 AM      Result Value Ref Range   Sodium 137  137 - 147 mEq/L   Potassium 3.8  3.7 - 5.3 mEq/L   Chloride 99  96 - 112 mEq/L   CO2 20  19 - 32 mEq/L   Glucose, Bld 196 (*) 70 - 99 mg/dL   BUN 10  6 - 23 mg/dL   Creatinine, Ser 0.89  0.50 - 1.35 mg/dL   Calcium 9.5  8.4 - 10.5 mg/dL   Total Protein 7.9  6.0 - 8.3 g/dL   Albumin 3.9  3.5 - 5.2 g/dL   AST 222 (*) 0 - 37 U/L   ALT 65 (*) 0 - 53 U/L   Alkaline Phosphatase 81  39 - 117 U/L   Total Bilirubin 0.7  0.3 - 1.2 mg/dL   GFR calc non Af Amer >90  >90 mL/min   GFR calc Af Amer >90  >90 mL/min   Comment: (NOTE)     The eGFR has been calculated using the CKD EPI equation.     This calculation has not been validated in all clinical situations.     eGFR's persistently <90 mL/min signify possible Chronic Kidney     Disease.   Anion gap 18 (*) 5 - 15  LIPID PANEL     Status: Abnormal   Collection Time    07/12/14  3:30 AM      Result Value Ref Range   Cholesterol 312 (*) 0 - 200 mg/dL   Triglycerides 138  <150 mg/dL   HDL 47  >39 mg/dL   Total CHOL/HDL Ratio 6.6     VLDL 28  0 - 40 mg/dL   LDL Cholesterol 237 (*) 0 - 99 mg/dL   Comment:            Total Cholesterol/HDL:CHD Risk     Coronary Heart Disease Risk Table  Men   Women      1/2 Average Risk   3.4   3.3      Average Risk       5.0   4.4      2 X Average Risk   9.6   7.1      3 X Average Risk  23.4   11.0                Use the calculated Patient Ratio     above and the CHD Risk Table     to determine the patient's CHD Risk.                ATP III CLASSIFICATION (LDL):      <100     mg/dL   Optimal      100-129  mg/dL   Near or Above                        Optimal      130-159  mg/dL   Borderline      160-189  mg/dL   High      >190     mg/dL   Very High  GLUCOSE, CAPILLARY     Status: Abnormal   Collection Time     07/12/14  7:54 AM      Result Value Ref Range   Glucose-Capillary 193 (*) 70 - 99 mg/dL   Comment 1 Capillary Sample    MAGNESIUM     Status: None   Collection Time    07/12/14 10:15 AM      Result Value Ref Range   Magnesium 2.0  1.5 - 2.5 mg/dL  GLUCOSE, CAPILLARY     Status: Abnormal   Collection Time    07/12/14 12:00 PM      Result Value Ref Range   Glucose-Capillary 183 (*) 70 - 99 mg/dL  GLUCOSE, CAPILLARY     Status: Abnormal   Collection Time    07/12/14  5:51 PM      Result Value Ref Range   Glucose-Capillary 143 (*) 70 - 99 mg/dL  GLUCOSE, CAPILLARY     Status: Abnormal   Collection Time    07/12/14  9:28 PM      Result Value Ref Range   Glucose-Capillary 132 (*) 70 - 99 mg/dL   Comment 1 Capillary Sample    CBC     Status: None   Collection Time    07/13/14  2:40 AM      Result Value Ref Range   WBC 7.7  4.0 - 10.5 K/uL   RBC 5.31  4.22 - 5.81 MIL/uL   Hemoglobin 15.7  13.0 - 17.0 g/dL   HCT 45.8  39.0 - 52.0 %   MCV 86.3  78.0 - 100.0 fL   MCH 29.6  26.0 - 34.0 pg   MCHC 34.3  30.0 - 36.0 g/dL   RDW 14.9  11.5 - 15.5 %   Platelets 272  150 - 400 K/uL  BASIC METABOLIC PANEL     Status: Abnormal   Collection Time    07/13/14  2:40 AM      Result Value Ref Range   Sodium 139  137 - 147 mEq/L   Potassium 3.8  3.7 - 5.3 mEq/L   Chloride 102  96 - 112 mEq/L   CO2 22  19 - 32 mEq/L   Glucose, Bld 154 (*) 70 - 99 mg/dL  BUN 14  6 - 23 mg/dL   Creatinine, Ser 0.97  0.50 - 1.35 mg/dL   Calcium 9.5  8.4 - 10.5 mg/dL   GFR calc non Af Amer >90  >90 mL/min   GFR calc Af Amer >90  >90 mL/min   Comment: (NOTE)     The eGFR has been calculated using the CKD EPI equation.     This calculation has not been validated in all clinical situations.     eGFR's persistently <90 mL/min signify possible Chronic Kidney     Disease.   Anion gap 15  5 - 15  PROTIME-INR     Status: None   Collection Time    07/13/14  2:40 AM      Result Value Ref Range   Prothrombin Time  13.8  11.6 - 15.2 seconds   INR 1.05  0.00 - 1.49  GLUCOSE, CAPILLARY     Status: Abnormal   Collection Time    07/13/14  7:26 AM      Result Value Ref Range   Glucose-Capillary 150 (*) 70 - 99 mg/dL   Comment 1 Capillary Sample      Imaging: Imaging results have been reviewed  Tele: SR/ST  Assessment/Plan:   1. Active Problems: 2.   Diabetes mellitus type 2 in obese 3.   Prostate ca 4.   Hyperlipemia 5.   Hypertension 6.   Acute ST elevation myocardial infarction (STEMI) 7.   Time Spent Directly with Patient:  20 minutes  Length of Stay:  LOS: 2 days   1. 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 On DAPT. Off IV NTG. NO CP/SOB. For staged LCX PCI today.  2. HTN- Still has HTN despite triturating his BP meds. Will follow and adjust  3. Long QT- Back down to 461 msec.   BERRY,JONATHAN J 07/13/2014, 8:03 AM

## 2014-07-13 NOTE — Interval H&P Note (Signed)
Cath Lab Visit (complete for each Cath Lab visit)  Clinical Evaluation Leading to the Procedure:   ACS: Yes.    Non-ACS:    Anginal Classification: CCS III  Anti-ischemic medical therapy: Maximal Therapy (2 or more classes of medications)  Non-Invasive Test Results: No non-invasive testing performed  Prior CABG: No previous CABG      History and Physical Interval Note:  07/13/2014 9:52 AM  Thomas Watts  has presented today for surgery, with the diagnosis of cad  The various methods of treatment have been discussed with the patient and family. After consideration of risks, benefits and other options for treatment, the patient has consented to  Procedure(s): PERCUTANEOUS CORONARY STENT INTERVENTION (PCI-S) (N/A) as a surgical intervention .  The patient's history has been reviewed, patient examined, no change in status, stable for surgery.  I have reviewed the patient's chart and labs.  Questions were answered to the patient's satisfaction.     Hermenegildo Clausen A

## 2014-07-13 NOTE — Progress Notes (Signed)
Subjective:  No CP/SOB, POD #2 Inf STEMI Rx with PCI/Stent  Objective:  Temp:  [97.8 F (36.6 C)-98.7 F (37.1 C)] 98.4 F (36.9 C) (10/30 0725) Pulse Rate:  [87-114] 100 (10/30 0700) Resp:  [17-27] 22 (10/30 0700) BP: (107-169)/(69-127) 144/99 mmHg (10/30 0700) SpO2:  [93 %-100 %] 97 % (10/30 0700) Weight:  [205 lb 7.5 oz (93.2 kg)] 205 lb 7.5 oz (93.2 kg) (10/30 0300) Weight change: -5 lb 11.7 oz (-2.6 kg)  Intake/Output from previous day: 10/29 0701 - 10/30 0700 In: 50 [I.V.:50] Out: 600 [Urine:600]  Intake/Output from this shift:    Physical Exam: General appearance: alert and no distress Neck: no adenopathy, no carotid bruit, no JVD, supple, symmetrical, trachea midline and thyroid not enlarged, symmetric, no tenderness/mass/nodules Lungs: clear to auscultation bilaterally Heart: regular rate and rhythm, S1, S2 normal, no murmur, click, rub or gallop Extremities: extremities normal, atraumatic, no cyanosis or edema  Lab Results: Results for orders placed during the hospital encounter of 07/11/14 (from the past 48 hour(s))  MRSA PCR SCREENING     Status: None   Collection Time    07/11/14  8:32 AM      Result Value Ref Range   MRSA by PCR NEGATIVE  NEGATIVE   Comment:            The GeneXpert MRSA Assay (FDA     approved for NASAL specimens     only), is one component of a     comprehensive MRSA colonization     surveillance program. It is not     intended to diagnose MRSA     infection nor to guide or     monitor treatment for     MRSA infections.  TSH     Status: None   Collection Time    07/11/14  9:00 AM      Result Value Ref Range   TSH 1.260  0.350 - 4.500 uIU/mL  HEMOGLOBIN A1C     Status: Abnormal   Collection Time    07/11/14  9:20 AM      Result Value Ref Range   Hemoglobin A1C 7.3 (*) <5.7 %   Comment: (NOTE)                                                                               According to the ADA Clinical Practice  Recommendations for 2011, when     HbA1c is used as a screening test:      >=6.5%   Diagnostic of Diabetes Mellitus               (if abnormal result is confirmed)     5.7-6.4%   Increased risk of developing Diabetes Mellitus     References:Diagnosis and Classification of Diabetes Mellitus,Diabetes     LKGM,0102,72(ZDGUY 1):S62-S69 and Standards of Medical Care in             Diabetes - 2011,Diabetes Care,2011,34 (Suppl 1):S11-S61.   Mean Plasma Glucose 163 (*) <117 mg/dL   Comment: Performed at [facility]  TROPONIN I     Status: Abnormal   Collection Time    07/11/14  9:20 AM  Result Value Ref Range   Troponin I >20.00 (*) <0.30 ng/mL   Comment:            Due to the release kinetics of cTnI,     a negative result within the first hours     of the onset of symptoms does not rule out     myocardial infarction with certainty.     If myocardial infarction is still suspected,     repeat the test at appropriate intervals.     HEMOLYZED SPECIMEN, RESULTS MAY BE AFFECTED     CRITICAL RESULT CALLED TO, READ BACK BY AND VERIFIED WITH:     M.NORMENT,RN 1050 07/11/14 CLARK,S  CBC     Status: None   Collection Time    07/11/14  9:20 AM      Result Value Ref Range   WBC 9.6  4.0 - 10.5 K/uL   RBC 5.18  4.22 - 5.81 MIL/uL   Hemoglobin 15.6  13.0 - 17.0 g/dL   HCT 44.3  39.0 - 52.0 %   MCV 85.5  78.0 - 100.0 fL   MCH 30.1  26.0 - 34.0 pg   MCHC 35.2  30.0 - 36.0 g/dL   RDW 14.4  11.5 - 15.5 %   Platelets 309  150 - 400 K/uL  CREATININE, SERUM     Status: None   Collection Time    07/11/14  9:20 AM      Result Value Ref Range   Creatinine, Ser 0.94  0.50 - 1.35 mg/dL   GFR calc non Af Amer >90  >90 mL/min   GFR calc Af Amer >90  >90 mL/min   Comment: (NOTE)     The eGFR has been calculated using the CKD EPI equation.     This calculation has not been validated in all clinical situations.     eGFR's persistently <90 mL/min signify possible Chronic Kidney     Disease.    GLUCOSE, CAPILLARY     Status: Abnormal   Collection Time    07/11/14  1:28 PM      Result Value Ref Range   Glucose-Capillary 163 (*) 70 - 99 mg/dL   Comment 1 Capillary Sample    TROPONIN I     Status: Abnormal   Collection Time    07/11/14  2:38 PM      Result Value Ref Range   Troponin I >20.00 (*) <0.30 ng/mL   Comment:            Due to the release kinetics of cTnI,     a negative result within the first hours     of the onset of symptoms does not rule out     myocardial infarction with certainty.     If myocardial infarction is still suspected,     repeat the test at appropriate intervals.     CRITICAL VALUE NOTED.  VALUE IS CONSISTENT WITH PREVIOUSLY REPORTED AND CALLED VALUE.  GLUCOSE, CAPILLARY     Status: Abnormal   Collection Time    07/11/14  4:11 PM      Result Value Ref Range   Glucose-Capillary 190 (*) 70 - 99 mg/dL   Comment 1 Capillary Sample    TROPONIN I     Status: Abnormal   Collection Time    07/11/14  8:28 PM      Result Value Ref Range   Troponin I >20.00 (*) <0.30 ng/mL   Comment:  Due to the release kinetics of cTnI,     a negative result within the first hours     of the onset of symptoms does not rule out     myocardial infarction with certainty.     If myocardial infarction is still suspected,     repeat the test at appropriate intervals.     CRITICAL VALUE NOTED.  VALUE IS CONSISTENT WITH PREVIOUSLY REPORTED AND CALLED VALUE.  GLUCOSE, CAPILLARY     Status: Abnormal   Collection Time    07/11/14  9:13 PM      Result Value Ref Range   Glucose-Capillary 190 (*) 70 - 99 mg/dL   Comment 1 Capillary Sample    CBC     Status: None   Collection Time    07/12/14  3:30 AM      Result Value Ref Range   WBC 9.2  4.0 - 10.5 K/uL   RBC 5.11  4.22 - 5.81 MIL/uL   Hemoglobin 15.6  13.0 - 17.0 g/dL   HCT 43.9  39.0 - 52.0 %   MCV 85.9  78.0 - 100.0 fL   MCH 30.5  26.0 - 34.0 pg   MCHC 35.5  30.0 - 36.0 g/dL   RDW 14.6  11.5 - 15.5 %    Platelets 245  150 - 400 K/uL  COMPREHENSIVE METABOLIC PANEL     Status: Abnormal   Collection Time    07/12/14  3:30 AM      Result Value Ref Range   Sodium 137  137 - 147 mEq/L   Potassium 3.8  3.7 - 5.3 mEq/L   Chloride 99  96 - 112 mEq/L   CO2 20  19 - 32 mEq/L   Glucose, Bld 196 (*) 70 - 99 mg/dL   BUN 10  6 - 23 mg/dL   Creatinine, Ser 0.89  0.50 - 1.35 mg/dL   Calcium 9.5  8.4 - 10.5 mg/dL   Total Protein 7.9  6.0 - 8.3 g/dL   Albumin 3.9  3.5 - 5.2 g/dL   AST 222 (*) 0 - 37 U/L   ALT 65 (*) 0 - 53 U/L   Alkaline Phosphatase 81  39 - 117 U/L   Total Bilirubin 0.7  0.3 - 1.2 mg/dL   GFR calc non Af Amer >90  >90 mL/min   GFR calc Af Amer >90  >90 mL/min   Comment: (NOTE)     The eGFR has been calculated using the CKD EPI equation.     This calculation has not been validated in all clinical situations.     eGFR's persistently <90 mL/min signify possible Chronic Kidney     Disease.   Anion gap 18 (*) 5 - 15  LIPID PANEL     Status: Abnormal   Collection Time    07/12/14  3:30 AM      Result Value Ref Range   Cholesterol 312 (*) 0 - 200 mg/dL   Triglycerides 138  <150 mg/dL   HDL 47  >39 mg/dL   Total CHOL/HDL Ratio 6.6     VLDL 28  0 - 40 mg/dL   LDL Cholesterol 237 (*) 0 - 99 mg/dL   Comment:            Total Cholesterol/HDL:CHD Risk     Coronary Heart Disease Risk Table  Men   Women      1/2 Average Risk   3.4   3.3      Average Risk       5.0   4.4      2 X Average Risk   9.6   7.1      3 X Average Risk  23.4   11.0                Use the calculated Patient Ratio     above and the CHD Risk Table     to determine the patient's CHD Risk.                ATP III CLASSIFICATION (LDL):      <100     mg/dL   Optimal      100-129  mg/dL   Near or Above                        Optimal      130-159  mg/dL   Borderline      160-189  mg/dL   High      >190     mg/dL   Very High  GLUCOSE, CAPILLARY     Status: Abnormal   Collection Time     07/12/14  7:54 AM      Result Value Ref Range   Glucose-Capillary 193 (*) 70 - 99 mg/dL   Comment 1 Capillary Sample    MAGNESIUM     Status: None   Collection Time    07/12/14 10:15 AM      Result Value Ref Range   Magnesium 2.0  1.5 - 2.5 mg/dL  GLUCOSE, CAPILLARY     Status: Abnormal   Collection Time    07/12/14 12:00 PM      Result Value Ref Range   Glucose-Capillary 183 (*) 70 - 99 mg/dL  GLUCOSE, CAPILLARY     Status: Abnormal   Collection Time    07/12/14  5:51 PM      Result Value Ref Range   Glucose-Capillary 143 (*) 70 - 99 mg/dL  GLUCOSE, CAPILLARY     Status: Abnormal   Collection Time    07/12/14  9:28 PM      Result Value Ref Range   Glucose-Capillary 132 (*) 70 - 99 mg/dL   Comment 1 Capillary Sample    CBC     Status: None   Collection Time    07/13/14  2:40 AM      Result Value Ref Range   WBC 7.7  4.0 - 10.5 K/uL   RBC 5.31  4.22 - 5.81 MIL/uL   Hemoglobin 15.7  13.0 - 17.0 g/dL   HCT 45.8  39.0 - 52.0 %   MCV 86.3  78.0 - 100.0 fL   MCH 29.6  26.0 - 34.0 pg   MCHC 34.3  30.0 - 36.0 g/dL   RDW 14.9  11.5 - 15.5 %   Platelets 272  150 - 400 K/uL  BASIC METABOLIC PANEL     Status: Abnormal   Collection Time    07/13/14  2:40 AM      Result Value Ref Range   Sodium 139  137 - 147 mEq/L   Potassium 3.8  3.7 - 5.3 mEq/L   Chloride 102  96 - 112 mEq/L   CO2 22  19 - 32 mEq/L   Glucose, Bld 154 (*) 70 - 99 mg/dL  BUN 14  6 - 23 mg/dL   Creatinine, Ser 0.97  0.50 - 1.35 mg/dL   Calcium 9.5  8.4 - 10.5 mg/dL   GFR calc non Af Amer >90  >90 mL/min   GFR calc Af Amer >90  >90 mL/min   Comment: (NOTE)     The eGFR has been calculated using the CKD EPI equation.     This calculation has not been validated in all clinical situations.     eGFR's persistently <90 mL/min signify possible Chronic Kidney     Disease.   Anion gap 15  5 - 15  PROTIME-INR     Status: None   Collection Time    07/13/14  2:40 AM      Result Value Ref Range   Prothrombin Time  13.8  11.6 - 15.2 seconds   INR 1.05  0.00 - 1.49  GLUCOSE, CAPILLARY     Status: Abnormal   Collection Time    07/13/14  7:26 AM      Result Value Ref Range   Glucose-Capillary 150 (*) 70 - 99 mg/dL   Comment 1 Capillary Sample      Imaging: Imaging results have been reviewed  Tele: SR/ST  Assessment/Plan:   1. Active Problems: 2.   Diabetes mellitus type 2 in obese 3.   Prostate ca 4.   Hyperlipemia 5.   Hypertension 6.   Acute ST elevation myocardial infarction (STEMI) 7.   Time Spent Directly with Patient:  20 minutes  Length of Stay:  LOS: 2 days   1. 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 On DAPT. Off IV NTG. NO CP/SOB. For staged LCX PCI today.  2. HTN- Still has HTN despite triturating his BP meds. Will follow and adjust  3. Long QT- Back down to 461 msec.   Thomas Watts,Thomas Watts 07/13/2014, 8:03 AM

## 2014-07-13 NOTE — CV Procedure (Signed)
Thomas Watts is a 53 y.o. male   [id number]  [id number] LOCATION:  FACILITY: [place]  PHYSICIAN: Troy Sine, MD, [facility] 1960/12/24   DATE OF PROCEDURE:  07/13/2014     PERCUTANEOUS CORONARY INTERVENTION   HISTORY:   Thomas Watts is a 53 year old African-American male who developed an acute coronary syndrome.  The morning of 07/11/2014.  Upon arrival to the emergency room.  His ECG showed deep ST segment depression in V3 and V4.  He had frequent runs of ventricular tachycardia.  He has a history of hypertension, hyperlipidemia, and type 2 diabetes mellitus in addition to tobacco use.  He was taken urgently to the catheterization laboratory by Dr. Martin[place] and underwent successful stenting of his RCA.  Due to significant CAD involving his circumflex vessel with 95% stenosis is referred for staged intervention of this high-grade circumflex stenosis.  Since his initial intervention, he had experienced burst of nonsustained ventricular tachycardia.   PROCEDURE: PCI of the left circumflex coronary artery via the right radial approach: PTCA/DES stenting  The patient was brought to the second floor [facility] lab in the fasting state. The patient was premedicated with Versed 2 mg and fentanyl 50 mcg. A right radial approach was utilized after an Allen's test verified adequate circulation. The right radial artery was punctured via the Seldinger technique, and a 6 [place] Glidesheath Slender was inserted without difficulty.  A radial cocktail consisting of Verapamil, IV nitroglycerin, and lidocaine was administered.  A safety J wire was initially advanced but this was changed to a Versicore due to initial resistance in the right upper arm which was advanced into the ascending aorta.  Bivalirudin bolus plus infusion was administered.  The patient received an additional 90 mg of oral Brilinta. He had been  previously  loaded 2 days ago and has been on daily therapy.  She was done with  a 6 [place] XB 3.5 guide.  After documentation of therapeutic anticoagulation, a pro-water wire was advanced down the circumflex distally.  In comparison to the initial study, there was significant improvement in the diffuse mid narrowing, but there was still a high-grade 90% more distal stenosis.  IC nitroglycerin was administered without improvement.  Predilatation was done with a 2.512 mm you for a balloon.  A 3.015 mm Xience Alpine DES stent was then inserted and deployed 2 at 10 and 11 atm.  A 3.2512 mm and see you for balloon was used for post stent dilatation and dilated 2 at 12 and 13 atm corresponding to approximately 3.21 mm. Angiography confirmed an excellent result.  The patient tolerated procedure well.  All catheters were removed from the patient.  A TR band was applied for hemostasis.  The patient returned to his room in stable condition chest pain-free with stable hemodynamics.  HEMODYNAMICS:   Central Aorta: 113/87  ANGIOGRAPHY:   At the start of the interventional procedure, the left main coronary artery was a short normal vessel which bifurcated into the LAD and left circumflex vessel.  Left circumflex vessel was moderate size vessel.  The previously noted diffuse narrowing in the mid segment had normalized but towards the distal aspect prior to a distal marginal branch there  was a 90% eccentric stenosis.  This did not improve following IC nitroglycerin administration.  Following PTCA and ultimate DES stenting with a 3.2515 mm Xience Alpine stent postdilated to 3.21 mm care, the 90% stenosis was reduced to 0%.  There was brisk TIMI 3 flow.  There is  no evidence for dissection.    IMPRESSION:  Successful percutaneous coronary intervention to the distal circumflex coronary artery with a 90% stenosis being reduced to 0% with ultimate insertion of a 3.0 x 15 mm Xience alpine DES stent postdilated 3.21 mm.   RECOMMENDATION:  Smoking cessation is imperative.  The patient will be  on medical therapy consisting of beta blocker, ace inhibition, statin, with plans for a minimum 1 year of dual antiplatelet therapy.   Troy Sine, MD, [facility] 07/13/2014 10:59 AM

## 2014-07-14 LAB — BASIC METABOLIC PANEL
ANION GAP: 15 (ref 5–15)
BUN: 15 mg/dL (ref 6–23)
CO2: 19 mEq/L (ref 19–32)
Calcium: 9 mg/dL (ref 8.4–10.5)
Chloride: 102 mEq/L (ref 96–112)
Creatinine, Ser: 0.97 mg/dL (ref 0.50–1.35)
GFR calc Af Amer: >90 mL/min (ref >90)
GLUCOSE: 133 mg/dL — AB (ref 70–99)
POTASSIUM: 4 meq/L (ref 3.7–5.3)
SODIUM: 136 meq/L — AB (ref 137–147)

## 2014-07-14 LAB — GLUCOSE, CAPILLARY
GLUCOSE-CAPILLARY: 169 mg/dL — AB (ref 70–99)
Glucose-Capillary: 150 mg/dL — ABNORMAL HIGH (ref 70–99)
Glucose-Capillary: 134 mg/dL — ABNORMAL HIGH (ref 70–99)
Glucose-Capillary: 119 mg/dL — ABNORMAL HIGH (ref 70–99)

## 2014-07-14 LAB — CBC
HCT: 44.5 % (ref 39.0–52.0)
Hemoglobin: 15.3 g/dL (ref 13.0–17.0)
MCH: 29.9 pg (ref 26.0–34.0)
MCHC: 34.4 g/dL (ref 30.0–36.0)
MCV: 87.1 fL (ref 78.0–100.0)
Platelets: 225 10*3/uL (ref 150–400)
RBC: 5.11 MIL/uL (ref 4.22–5.81)
RDW: 14.6 % (ref 11.5–15.5)
WBC: 6.9 10*3/uL (ref 4.0–10.5)

## 2014-07-14 NOTE — Progress Notes (Signed)
Patient Name: Thomas Watts Date of Encounter: 07/14/2014     Active Problems:   Diabetes mellitus type 2 in obese   Prostate ca   Hyperlipemia   Hypertension   Acute ST elevation myocardial infarction (STEMI)    SUBJECTIVE  The patient feels better.  He denies any chest pain or shortness of breath.  Blood pressure is still running a little high.  He denies any dyspnea.  CURRENT MEDS . amLODipine  10 mg Oral Daily  . aspirin EC  81 mg Oral Daily  . atorvastatin  80 mg Oral q1800  . heparin  5,000 Units Subcutaneous 3 times per day  . insulin aspart  0-15 Units Subcutaneous TID WC  . lisinopril  20 mg Oral Daily  . metoprolol tartrate  25 mg Oral TID  . sodium chloride  3 mL Intravenous Q12H  . ticagrelor  90 mg Oral BID    OBJECTIVE  Filed Vitals:   07/13/14 2300 07/14/14 0320 07/14/14 0758 07/14/14 1205  BP: 140/103 150/99 138/107 135/81  Pulse: 82 90 110 97  Temp: 98.7 F (37.1 C) 98.3 F (36.8 C) 98.7 F (37.1 C) 98.4 F (36.9 C)  TempSrc: Oral Oral Oral Oral  Resp: 24 16 34 23  Height:      Weight:  216 lb 0.8 oz (98 kg)    SpO2: 96% 97% 95% 96%    Intake/Output Summary (Last 24 hours) at 07/14/14 1239 Last data filed at 07/14/14 1100  Gross per 24 hour  Intake   2855 ml  Output   1125 ml  Net   1730 ml   Filed Weights   07/12/14 0300 07/13/14 0300 07/14/14 0320  Weight: 211 lb 3.2 oz (95.8 kg) 205 lb 7.5 oz (93.2 kg) 216 lb 0.8 oz (98 kg)    PHYSICAL EXAM  General: Pleasant, NAD. Neuro: Alert and oriented X 3. Moves all extremities spontaneously. Psych: Normal affect. HEENT:  Normal  Neck: Supple without bruits or JVD. Lungs:  Resp regular and unlabored, CTA. Heart: RRR no s3, s4, or murmurs. Abdomen: Soft, non-tender, non-distended, BS + x 4.  Extremities: No clubbing, cyanosis or edema. DP/PT/Radials 2+ and equal bilaterally.  Accessory Clinical Findings  CBC  Recent Labs  07/13/14 0240 07/14/14 0337  WBC 7.7 6.9  HGB 15.7  15.3  HCT 45.8 44.5  MCV 86.3 87.1  PLT 272 315   Basic Metabolic Panel  Recent Labs  07/12/14 0330 07/12/14 1015 07/13/14 0240 07/14/14 0337  NA 137  --  139 136*  K 3.8  --  3.8 4.0  CL 99  --  102 102  CO2 20  --  22 19  GLUCOSE 196*  --  154* 133*  BUN 10  --  14 15  CREATININE 0.89  --  0.97 0.97  CALCIUM 9.5  --  9.5 9.0  MG  --  2.0  --   --    Liver Function Tests  Recent Labs  07/12/14 0330  AST 222*  ALT 65*  ALKPHOS 81  BILITOT 0.7  PROT 7.9  ALBUMIN 3.9   No results found for this basename: LIPASE, AMYLASE,  in the last 72 hours Cardiac Enzymes  Recent Labs  07/11/14 1438 07/11/14 2028  TROPONINI >20.00* >20.00*   BNP No components found with this basename: POCBNP,  D-Dimer No results found for this basename: DDIMER,  in the last 72 hours Hemoglobin A1C No results found for this basename: HGBA1C,  in the  last 72 hours Fasting Lipid Panel  Recent Labs  07/12/14 0330  CHOL 312*  HDL 47  LDLCALC 237*  TRIG 138  CHOLHDL 6.6   Thyroid Function Tests No results found for this basename: TSH, T4TOTAL, FREET3, T3FREE, THYROIDAB,  in the last 72 hours  TELE  Normal sinus rhythm  ECG  Normal sinus rhythm.  Inferior-posterior infarct , age undetermined Abnormal ECG  Radiology/Studies  Dg Chest Portable 1 View  07/11/2014   CLINICAL DATA:  Chest pain, shortness of breath.  EXAM: PORTABLE CHEST - 1 VIEW  COMPARISON:  None.  FINDINGS: External support devices obscure portions of the mediastinum and left lung. Allowing for this, cardiomediastinal contours within normal range. No confluent airspace opacity. No pleural effusion or pneumothorax. No definite acute osseous finding.  IMPRESSION: Chest is partially obscured by overlying support devices. Otherwise, no radiographic evidence of active cardiopulmonary disease.   Electronically Signed   By: Carlos Levering M.D.   On: 07/11/2014 06:11    ASSESSMENT AND PLAN  1.  Non-STEMI secondary  to severe 2 vessel obstructive coronary artery disease.  He underwent staged intervention initially with stenting of the proximal to mid RCA with overlapping drug-eluting stent and with subsequent stenting of the left circumflex with drug-eluting stent 2.  Tobacco abuse 3.  Essential hypertension  Disposition:Smoking cessation is imperative. The patient will be on medical therapy consisting of beta blocker, ace inhibition, statin, with plans for a minimum 1 year of dual antiplatelet therapy.  Possibly home Sunday.   Signed, Darlin Coco MD

## 2014-07-14 NOTE — Progress Notes (Signed)
After reviewing Thomas Watts chart, I called Dr. Alejandro Mulling, the cardiology fellow.  A request was made to move him to a telemetry bed if needed due to his possible discharge in the morning.  The order was received and entered.

## 2014-07-14 NOTE — Progress Notes (Signed)
CARDIAC REHAB PHASE I   PRE:  Rate/Rhythm: 93 SR    BP: sitting 135/81    SaO2: 97 RA  MODE:  Ambulation: 390 ft   POST:  Rate/Rhythm: 115 ST upon standing, 108 ST rest of walk    BP: sitting 157/105, 132/102 after 20 min    SaO2: 98 RA   Thankful to walk. Made funny faces at times but sts he felt fine. Slight lightheadedness. BP elevated after walking. To recliner. Ed completed. Pt motivated to make change. Gave new stent card. Teach back done for NTG and Brilinta. [phone]  Josephina Shih [place] CES, ACSM 07/14/2014 12:40 PM

## 2014-07-14 NOTE — Progress Notes (Signed)
Report called to nurse on [id number].  Vitals stable, belongings packed and medications are in the chart .

## 2014-07-15 ENCOUNTER — Encounter (HOSPITAL_COMMUNITY): Payer: Self-pay | Admitting: Physician Assistant

## 2014-07-15 DIAGNOSIS — Z720 Tobacco use: Secondary | ICD-10-CM | POA: Diagnosis present

## 2014-07-15 DIAGNOSIS — F1210 Cannabis abuse, uncomplicated: Secondary | ICD-10-CM | POA: Diagnosis present

## 2014-07-15 DIAGNOSIS — I2111 ST elevation (STEMI) myocardial infarction involving right coronary artery: Secondary | ICD-10-CM

## 2014-07-15 DIAGNOSIS — E669 Obesity, unspecified: Secondary | ICD-10-CM | POA: Diagnosis present

## 2014-07-15 DIAGNOSIS — I4729 Other ventricular tachycardia: Secondary | ICD-10-CM

## 2014-07-15 DIAGNOSIS — I472 Ventricular tachycardia: Secondary | ICD-10-CM

## 2014-07-15 DIAGNOSIS — I2510 Atherosclerotic heart disease of native coronary artery without angina pectoris: Secondary | ICD-10-CM | POA: Diagnosis present

## 2014-07-15 DIAGNOSIS — I214 Non-ST elevation (NSTEMI) myocardial infarction: Secondary | ICD-10-CM

## 2014-07-15 LAB — CBC
HCT: 42.3 % (ref 39.0–52.0)
Hemoglobin: 14.6 g/dL (ref 13.0–17.0)
MCH: 29.5 pg (ref 26.0–34.0)
MCHC: 34.5 g/dL (ref 30.0–36.0)
MCV: 85.5 fL (ref 78.0–100.0)
Platelets: 266 10*3/uL (ref 150–400)
RBC: 4.95 MIL/uL (ref 4.22–5.81)
RDW: 14.3 % (ref 11.5–15.5)
WBC: 6.1 10*3/uL (ref 4.0–10.5)

## 2014-07-15 LAB — GLUCOSE, CAPILLARY: Glucose-Capillary: 154 mg/dL — ABNORMAL HIGH (ref 70–99)

## 2014-07-15 MED ORDER — AMLODIPINE BESYLATE 10 MG PO TABS: 10.0000 mg | ORAL_TABLET | Freq: Every day | ORAL | Status: DC

## 2014-07-15 MED ORDER — TICAGRELOR 90 MG PO TABS: 90.0000 mg | ORAL_TABLET | Freq: Two times a day (BID) | ORAL | Status: DC

## 2014-07-15 MED ORDER — NICOTINE 14 MG/24HR TD PT24
14.0000 mg | MEDICATED_PATCH | Freq: Every day | TRANSDERMAL | Status: DC
Start: 2014-07-15 — End: 2014-07-15
  Filled 2014-07-15: qty 1

## 2014-07-15 MED ORDER — NICOTINE 14 MG/24HR TD PT24: 14.0000 mg | MEDICATED_PATCH | Freq: Every day | TRANSDERMAL | Status: DC

## 2014-07-15 MED ORDER — ASPIRIN 81 MG PO TBEC: 81.0000 mg | DELAYED_RELEASE_TABLET | Freq: Every day | ORAL | Status: DC

## 2014-07-15 MED ORDER — NITROGLYCERIN 0.4 MG SL SUBL: 0.4000 mg | SUBLINGUAL_TABLET | SUBLINGUAL | Status: DC | PRN

## 2014-07-15 MED ORDER — METOPROLOL TARTRATE 25 MG PO TABS: 25.0000 mg | ORAL_TABLET | Freq: Three times a day (TID) | ORAL | Status: DC

## 2014-07-15 MED ORDER — ATORVASTATIN CALCIUM 80 MG PO TABS: 80.0000 mg | ORAL_TABLET | Freq: Every day | ORAL | Status: DC

## 2014-07-15 NOTE — Progress Notes (Signed)
       Patient Name: Thomas Watts Date of Encounter: 07/15/2014    SUBJECTIVE: Inferior STEMI on 10/28 15 and elective Cfx stent on 07/13/14. No chest pain and EF by echo is normal.  TELEMETRY:  NSR Filed Vitals:   07/14/14 2354 07/15/14 0500 07/15/14 0505 07/15/14 0900  BP: 143/97  111/71 124/96  Pulse: 100  96 110  Temp: 99.2 F (37.3 C)  98.5 F (36.9 C)   TempSrc: Oral  Oral   Resp:   20   Height:      Weight:  210 lb 11.2 oz (95.573 kg)    SpO2: 99%  98%     Intake/Output Summary (Last 24 hours) at 07/15/14 1220 Last data filed at 07/15/14 1020  Gross per 24 hour  Intake    393 ml  Output    400 ml  Net     -7 ml   LABS: Basic Metabolic Panel:  Recent Labs  07/13/14 0240 07/14/14 0337  NA 139 136*  K 3.8 4.0  CL 102 102  CO2 22 19  GLUCOSE 154* 133*  BUN 14 15  CREATININE 0.97 0.97  CALCIUM 9.5 9.0   CBC:  Recent Labs  07/14/14 0337 07/15/14 0530  WBC 6.9 6.1  HGB 15.3 14.6  HCT 44.5 42.3  MCV 87.1 85.5  PLT 225 266    Radiology/Studies:  none  Physical Exam: Blood pressure 124/96, pulse 110, temperature 98.5 F (36.9 C), temperature source Oral, resp. rate 20, height 5\' 11"  (1.803 m), weight 210 lb 11.2 oz (95.573 kg), SpO2 98 %. Weight change: -5 lb 5.6 oz (-2.427 kg)  Wt Readings from Last 3 Encounters:  07/15/14 210 lb 11.2 oz (95.573 kg)    Chest clear  Cardiac with S4 gallop.  ASSESSMENT:  1. Post MI with RCA DES assited reperfusion and staged Cfx DES 48 hrs later. 2. Stable with no CHF or other issues 3. Hyperlipidemia  Plan:  Home today DAPT for 1 year Statin Beta blocker/amlodipine Case manager about meds.   Demetrios Isaacs 07/15/2014, 12:20 PM

## 2014-07-15 NOTE — Discharge Summary (Signed)
Discharge Summary   Patient ID: Thomas Watts MRN: [id number], DOB/AGE: [birth year] 53 y.o. Admit date: 07/11/2014 D/C date:     07/15/2014  Primary Cardiologist: Dr. Martinique (new)  Principal Problem:   NSTEMI (non-ST elevated myocardial infarction) Active Problems:   Diabetes mellitus type 2 in obese   Prostate ca   Hyperlipemia   Hypertension   Obesity   Marijuana abuse   Tobacco abuse   NSVT (nonsustained ventricular tachycardia)   CAD (coronary artery disease)    Admission Dates: 07/11/14-07/15/14 Discharge Diagnosis: NSTEMI and repetitive NSVT s/p overlapping DESx2 to [facility] on 07/11/14 and staged PCI w/ DES to LCx on 07/13/14  HPI: Thomas Watts is a 53 y.o. male with a history of hypertension, hyperlipidemia, T2DM, and continued tobacco use who presented to [facility] on 07/11/14 with chest pain and acute dyspnea. He was found to have an NSTEMI, repetitive NSVT and ongoing chest pain and was taken back for emergent cardiac catheterization.   Upon presentation he reported acute onset severe dyspnea and chest pain at 4 am. States he was watching TV when he suddenly couldn't breath. He had severe mid sternal chest pain radiating to the back and down his left arm associated with numbness in his left arm. Presented to ED where Ecg shows deep ST depression in V3-4. Troponin is elevated at 0.86. Patient having frequent runs of Ventricular tachycardia.  Hospital Course  CAD/NSTEMI- Pk trop >20 -- s/p emergency LHC on 07/11/14 which revealed  1. Severe 2 vessel obstructive CAD  2. Normal LV function  3. Successful stenting of the proximal to mid RCA with overlapping DES. -- He was taken back for PCI of his LCx with 95% stenosis and is now s/p DES to LCx  -- 2D ECHO with EF 50-55%, g1DD, no WMAs and mild RA dilation. No s/s CHF -- Continue ASA/Brilinta for minimum of 1 year, atorvastatin 34m and Lopressor  HTN- initially difficult to control.  -- Now well controlled on Norvasc  176m enalapril 2029mLopressor 64m68mD  HLD- uncontrolled. LDL 237 -- Continue atorvastatin 80mg51m Lovaza  Tobacco abuse- patches have worked for him in the past so I have sent in a Rx to his pharmacy   The patient has had an uncomplicated hospital course and is recovering well. The radial catheter site is stable. He has been seen by Dr. SmithTamala Juliany and deemed ready for discharge home. A staff message for a TOC follow up appointment has been sent. Smoking cessation was disscussed in length. A written RX for a 30 day free supply of Brilinta was provided for the patient. Discharge medications are listed below.   Discharge Vitals: Blood pressure 124/96, pulse 110, temperature 98.5 F (36.9 C), temperature source Oral, resp. rate 20, height 5' 11"  (1.803 m), weight 210 lb 11.2 oz (95.573 kg), SpO2 98 %.  Labs: Lab Results  Component Value Date   WBC 6.1 07/15/2014   HGB 14.6 07/15/2014   HCT 42.3 07/15/2014   MCV 85.5 07/15/2014   PLT 266 07/15/2014    Recent Labs Lab 07/12/14 0330  07/14/14 0337  NA 137  < > 136*  K 3.8  < > 4.0  CL 99  < > 102  CO2 20  < > 19  BUN 10  < > 15  CREATININE 0.89  < > 0.97  CALCIUM 9.5  < > 9.0  PROT 7.9  --   --   BILITOT 0.7  --   --  ALKPHOS 81  --   --   ALT 65*  --   --   AST 222*  --   --   GLUCOSE 196*  < > 133*  < > = values in this interval not displayed.  Lab Results  Component Value Date   CHOL 312* 07/12/2014   HDL 47 07/12/2014   LDLCALC 237* 07/12/2014   TRIG 138 07/12/2014    Diagnostic Studies/Procedures   Dg Chest Portable 1 View  07/11/2014   CLINICAL DATA:  Chest pain, shortness of breath.  EXAM: PORTABLE CHEST - 1 VIEW  COMPARISON:  None.  FINDINGS: External support devices obscure portions of the mediastinum and left lung. Allowing for this, cardiomediastinal contours within normal range. No confluent airspace opacity. No pleural effusion or pneumothorax. No definite acute osseous finding.  IMPRESSION: Chest is  partially obscured by overlying support devices. Otherwise, no radiographic evidence of active cardiopulmonary disease.   Electronically Signed   By: Carlos Levering M.D.   On: 07/11/2014 06:11      07/11/14 Cardiac Catheterization Procedure Note Name: Adain Geurin MRN: [id number] DOB: 30-Apr-1961 Procedure: Left Heart Cath, Selective Coronary Angiography, LV angiography, PTCA and stenting of the proximal to mid RCA Indication: 53 yo BM with NSTEMI and repetitive nonsustained VT and ongoing chest pain. Procedural Details: The right wrist was prepped, draped, and anesthetized with 1% lidocaine. Using the modified Seldinger technique, a 6 French slender sheath was introduced into the right radial artery. 3 mg of verapamil was administered through the sheath, weight-based unfractionated heparin was administered intravenously. Standard Judkins catheters were used for selective coronary angiography and left ventriculography. Catheter exchanges were performed over an exchange length guidewire. PROCEDURAL FINDINGS Hemodynamics: AO 140/93 mean 115 mm Hg LV 136/7 mm Hg Coronary angiography: Coronary dominance: right Left mainstem: Normal Left anterior descending (LAD): The LAD is a large vessel with diffuse 30% disease in the mid to distal vessel.  There is a small ramus intermediate branch with 80% proximal stenosis. Left circumflex (LCx): The LCx is a large vessel giving rise to a single large branching OM. There is diffuse stenosis in the mid LCx prior to the OM up to 80%. Right coronary artery (RCA): The RCA is diffusely diseased in the proximal to mid vessel. There is a focal 95% stenosis in the proximal segment Left ventriculography: Left ventricular systolic function is normal, LVEF is estimated at 55-65%, there is no significant mitral regurgitation  PCI Note: Following the diagnostic procedure, the decision was made to proceed with PCI of the RCA. Weight-based bivalirudin was  given for anticoagulation. Brilinta 180 mg was given orally. Once a therapeutic ACT was achieved, a 6 [place] Hockey stick guide catheter was inserted. A prowater coronary guidewire was used to cross the lesion. It was difficult to wire the past the crux and the wire was positioned in a side branch. The lesion was predilated with a 2.0 mm balloon. The lesion was then stented with a 2.5 x 38 mm Promus stent. At this point there was evidence of a focal dissection at the crux. A second Choice PT wire was passed across this area into the distal RCA. We attempted to pass a stent into this region but it would not cross. We predilated the area with a 2.5 mm balloon but still were unable to cross with the stent. We then used a Guideliner catheter and brought this through the first stent over the 2.5 mm balloon. With Guideliner support we were then able to  deliver a 2.5 x 32 mm Promus stent to cover the dissection at the crux and overlap with the first stent. The entire stented segment was postdilated with a 3.0 mm noncompliant balloon. Following PCI, there was 0% residual stenosis and TIMI-3 flow. Final angiography confirmed an excellent result. The patient tolerated the procedure well. There were no immediate procedural complications. A TR band was used for radial hemostasis. The patient was transferred to the post catheterization recovery area for further monitoring. PCI Data: Vessel - RCA/Segment - proximal to mid Percent Stenosis (pre) 95% TIMI-flow 2 Stent 2.5 x 38 and 2.5 x 32 mm Promus stents post dilated to 3.0 mm. Percent Stenosis (post) 0% TIMI-flow (post) 3 Final Conclusions:  1. Severe 2 vessel obstructive CAD 2. Normal LV function 3. Successful stenting of the proximal to mid RCA with overlapping DES. Recommendations:  DAPT for at least one year. Aggressive risk factor modification. Would consider bringing the patient back to the lab for stenting of the LCx on  Friday.          PERCUTANEOUS CORONARY INTERVENTION 07/13/14 HISTORY:  Mr. Siaosi Alter is a 53 year old African-American male who developed an acute coronary syndrome. The morning of 07/11/2014. Upon arrival to the emergency room. His ECG showed deep ST segment depression in V3 and V4. He had frequent runs of ventricular tachycardia. He has a history of hypertension, hyperlipidemia, and type 2 diabetes mellitus in addition to tobacco use. He was taken urgently to the catheterization laboratory by Dr. Martinique and underwent successful stenting of his RCA. Due to significant CAD involving his circumflex vessel with 95% stenosis is referred for staged intervention of this high-grade circumflex stenosis. Since his initial intervention, he had experienced burst of nonsustained ventricular tachycardia. PROCEDURE: PCI of the left circumflex coronary artery via the right radial approach: PTCA/DES stenting The patient was brought to the second floor [facility] cath lab in the fasting state. The patient was premedicated with Versed 2 mg and fentanyl 50 mcg. A right radial approach was utilized after an Allen's test verified adequate circulation. The right radial artery was punctured via the Seldinger technique, and a 6 [place] Glidesheath Slender was inserted without difficulty. A radial cocktail consisting of Verapamil, IV nitroglycerin, and lidocaine was administered. A safety J wire was initially advanced but this was changed to a Versicore due to initial resistance in the right upper arm which was advanced into the ascending aorta. Bivalirudin bolus plus infusion was administered. The patient received an additional 90 mg of oral Brilinta. He had been previously loaded 2 days ago and has been on daily therapy. She was done with a 6 [place] XB 3.5 guide. After documentation of therapeutic anticoagulation, a pro-water wire was advanced down the circumflex distally. In comparison to  the initial study, there was significant improvement in the diffuse mid narrowing, but there was still a high-grade 90% more distal stenosis. IC nitroglycerin was administered without improvement. Predilatation was done with a 2.512 mm you for a balloon. A 3.015 mm Xience Alpine DES stent was then inserted and deployed 2 at 10 and 11 atm. A 3.2512 mm and see you for balloon was used for post stent dilatation and dilated 2 at 12 and 13 atm corresponding to approximately 3.21 mm. Angiography confirmed an excellent result. The patient tolerated procedure well. All catheters were removed from the patient. A TR band was applied for hemostasis. The patient returned to his room in stable condition chest pain-free with stable hemodynamics. HEMODYNAMICS:  Central Aorta: 113/87 ANGIOGRAPHY:  At the start of the interventional procedure, the left main coronary artery was a short normal vessel which bifurcated into the LAD and left circumflex vessel. Left circumflex vessel was moderate size vessel. The previously noted diffuse narrowing in the mid segment had normalized but towards the distal aspect prior to a distal marginal branch there was a 90% eccentric stenosis. This did not improve following IC nitroglycerin administration. Following PTCA and ultimate DES stenting with a 3.2515 mm Xience Alpine stent postdilated to 3.21 mm care, the 90% stenosis was reduced to 0%. There was brisk TIMI 3 flow. There is no evidence for dissection. IMPRESSION: Successful percutaneous coronary intervention to the distal circumflex coronary artery with a 90% stenosis being reduced to 0% with ultimate insertion of a 3.0 x 15 mm Xience alpine DES stent postdilated 3.21 mm      2D ECHO Study Date: 07/11/2014 LV EF: 50% -  55% ------------------------------------------------------------------- Study Conclusions - Left ventricle: The cavity size was normal. Wall thickness was normal. Systolic function was  normal. The estimated ejection fraction was in the range of 50% to 55%. Wall motion was normal; there were no regional wall motion abnormalities. Doppler parameters are consistent with abnormal left ventricular relaxation (grade 1 diastolic dysfunction). - Right atrium: The atrium was mildly dilated.       Discharge Medications     Medication List    STOP taking these medications        acetaminophen-codeine 300-30 MG per tablet  Commonly known as:  TYLENOL #3      TAKE these medications        amLODipine 10 MG tablet  Commonly known as:  NORVASC  Take 1 tablet (10 mg total) by mouth daily.     aspirin 81 MG EC tablet  Take 1 tablet (81 mg total) by mouth daily.     atorvastatin 80 MG tablet  Commonly known as:  LIPITOR  Take 1 tablet (80 mg total) by mouth daily at 6 PM.     enalapril 20 MG tablet  Commonly known as:  VASOTEC  Take 20 mg by mouth daily.     metoprolol tartrate 25 MG tablet  Commonly known as:  LOPRESSOR  Take 1 tablet (25 mg total) by mouth 3 (three) times daily.     nicotine 14 mg/24hr patch  Commonly known as:  NICODERM CQ - dosed in mg/24 hours  Place 1 patch (14 mg total) onto the skin daily.     nitroGLYCERIN 0.4 MG SL tablet  Commonly known as:  NITROSTAT  Place 1 tablet (0.4 mg total) under the tongue every 5 (five) minutes x 3 doses as needed for chest pain.     omega-3 acid ethyl esters 1 G capsule  Commonly known as:  LOVAZA  Take 2 g by mouth daily.     ticagrelor 90 MG Tabs tablet  Commonly known as:  BRILINTA  Take 1 tablet (90 mg total) by mouth 2 (two) times daily.        Disposition   The patient will be discharged in stable condition to home. Discharge Instructions    Amb Referral to Cardiac Rehabilitation    Complete by:  As directed           Follow-up Information    Follow up with Peter Martinique, MD.   Specialty:  Cardiology   Why:  The office will call you to make an appoinment., If you do not hear  from  them, please contact them., You should be seen within 1-week.   Contact information:   [place] [address] [phone]         Duration of Discharge Encounter: Greater than 30 minutes including physician and PA time.  Mable Fill R PA-C 07/15/2014, 2:24 PM

## 2014-07-16 ENCOUNTER — Telehealth: Payer: Self-pay | Admitting: Cardiology

## 2014-07-16 LAB — GLUCOSE, CAPILLARY: GLUCOSE-CAPILLARY: 147 mg/dL — AB (ref 70–99)

## 2014-07-16 MED ORDER — ENALAPRIL MALEATE 20 MG PO TABS: 20.0000 mg | ORAL_TABLET | Freq: Every day | ORAL | Status: DC

## 2014-07-16 MED ORDER — TICAGRELOR 90 MG PO TABS: 90.0000 mg | ORAL_TABLET | Freq: Two times a day (BID) | ORAL | Status: DC

## 2014-07-16 MED ORDER — AMLODIPINE BESYLATE 10 MG PO TABS: 10.0000 mg | ORAL_TABLET | Freq: Every day | ORAL | Status: DC

## 2014-07-16 MED ORDER — OMEGA-3-ACID ETHYL ESTERS 1 G PO CAPS: 2.0000 g | ORAL_CAPSULE | Freq: Every day | ORAL | Status: DC

## 2014-07-16 MED ORDER — METOPROLOL TARTRATE 25 MG PO TABS: 25.0000 mg | ORAL_TABLET | Freq: Three times a day (TID) | ORAL | Status: DC

## 2014-07-16 MED ORDER — ATORVASTATIN CALCIUM 80 MG PO TABS: 80.0000 mg | ORAL_TABLET | Freq: Every day | ORAL | Status: DC

## 2014-07-16 MED ORDER — NITROGLYCERIN 0.4 MG SL SUBL: 0.4000 mg | SUBLINGUAL_TABLET | SUBLINGUAL | Status: DC | PRN

## 2014-07-16 MED FILL — Sodium Chloride IV Soln 0.9%: INTRAVENOUS | Qty: 50 | Status: AC

## 2014-07-16 NOTE — Telephone Encounter (Signed)
Pt called requesting that all his medications be transferred to the Outpatient. He is currently asking that his for his Enalapril and Atorvastatin to be refilled because he is out. Please call  Thanks

## 2014-07-16 NOTE — Telephone Encounter (Signed)
Rx sent to [facility] on [address] per patient request

## 2014-07-26 ENCOUNTER — Telehealth: Payer: Self-pay | Admitting: Cardiology

## 2014-07-26 ENCOUNTER — Encounter: Payer: BC Managed Care – PPO | Admitting: Nurse Practitioner

## 2014-07-26 NOTE — Telephone Encounter (Signed)
New message    Spoke with patient regarding appt   TCM appt on 11/13 @ 8:30 with Ignacia Bayley  He needs a 1 week TOC appt after STEMI and PCI. He will follow with Dr. Martinique. Thanks!!!!

## 2014-07-26 NOTE — Telephone Encounter (Signed)
Confirmed with patient that he has an appointment with Ignacia Bayley tomorrow at 8:30 am for TCM after hospitaliazation. Advised him to bring in all his medications. Voiced good understanding.

## 2014-07-27 ENCOUNTER — Encounter: Payer: Self-pay | Admitting: Nurse Practitioner

## 2014-07-27 ENCOUNTER — Ambulatory Visit (INDEPENDENT_AMBULATORY_CARE_PROVIDER_SITE_OTHER): Payer: BC Managed Care – PPO | Admitting: Nurse Practitioner

## 2014-07-27 VITALS — BP 116/80 | HR 90 | Ht 71.0 in | Wt 221.4 lb

## 2014-07-27 DIAGNOSIS — I214 Non-ST elevation (NSTEMI) myocardial infarction: Secondary | ICD-10-CM

## 2014-07-27 DIAGNOSIS — I222 Subsequent non-ST elevation (NSTEMI) myocardial infarction: Secondary | ICD-10-CM

## 2014-07-27 DIAGNOSIS — E785 Hyperlipidemia, unspecified: Secondary | ICD-10-CM

## 2014-07-27 DIAGNOSIS — I10 Essential (primary) hypertension: Secondary | ICD-10-CM

## 2014-07-27 DIAGNOSIS — Z720 Tobacco use: Secondary | ICD-10-CM

## 2014-07-27 DIAGNOSIS — I2510 Atherosclerotic heart disease of native coronary artery without angina pectoris: Secondary | ICD-10-CM

## 2014-07-27 MED ORDER — METOPROLOL TARTRATE 25 MG PO TABS: 37.5000 mg | ORAL_TABLET | Freq: Two times a day (BID) | ORAL | Status: DC

## 2014-07-27 NOTE — Patient Instructions (Addendum)
Your physician has recommended you make the following change in your medication:    METOPROLOL 25 MG ONE TABLET AND HALF = 37.5 MG TWICE A DAY   Your physician recommends that you schedule a follow-up appointment in: WITH DR Martinique IN 3 MONTHS
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
## 2014-08-23 ENCOUNTER — Encounter (HOSPITAL_COMMUNITY): Payer: Self-pay | Admitting: Cardiology

## 2014-09-03 ENCOUNTER — Other Ambulatory Visit: Payer: Self-pay

## 2014-09-03 MED ORDER — ATORVASTATIN CALCIUM 80 MG PO TABS: 80.0000 mg | ORAL_TABLET | Freq: Every day | ORAL | Status: DC

## 2014-09-03 NOTE — Telephone Encounter (Signed)
Rx sent to pharmacy   

## 2014-09-04 ENCOUNTER — Other Ambulatory Visit: Payer: Self-pay

## 2014-09-13 ENCOUNTER — Other Ambulatory Visit: Payer: Self-pay | Admitting: *Deleted

## 2014-09-13 MED ORDER — OMEGA-3-ACID ETHYL ESTERS 1 G PO CAPS: 2.0000 g | ORAL_CAPSULE | Freq: Every day | ORAL | Status: DC

## 2014-09-13 MED ORDER — AMLODIPINE BESYLATE 10 MG PO TABS: 10.0000 mg | ORAL_TABLET | Freq: Every day | ORAL | Status: DC

## 2014-09-13 MED ORDER — ATORVASTATIN CALCIUM 80 MG PO TABS: 80.0000 mg | ORAL_TABLET | Freq: Every day | ORAL | Status: DC

## 2014-09-13 MED ORDER — METOPROLOL TARTRATE 25 MG PO TABS: 37.5000 mg | ORAL_TABLET | Freq: Two times a day (BID) | ORAL | Status: DC

## 2014-09-19 ENCOUNTER — Other Ambulatory Visit: Payer: Self-pay

## 2014-09-19 MED ORDER — METOPROLOL TARTRATE 25 MG PO TABS
37.5000 mg | ORAL_TABLET | Freq: Two times a day (BID) | ORAL | Status: DC
Start: 2014-09-19 — End: 2022-10-22

## 2014-10-05 ENCOUNTER — Other Ambulatory Visit: Payer: Self-pay | Admitting: Pharmacist Clinician (PhC)/ Clinical Pharmacy Specialist

## 2014-10-05 MED ORDER — TICAGRELOR 90 MG PO TABS: 90.0000 mg | ORAL_TABLET | Freq: Two times a day (BID) | ORAL | Status: DC

## 2014-10-29 ENCOUNTER — Ambulatory Visit: Payer: BC Managed Care – PPO | Admitting: Cardiology

## 2014-11-07 ENCOUNTER — Encounter: Payer: Self-pay | Admitting: Cardiology

## 2014-12-03 ENCOUNTER — Encounter (HOSPITAL_COMMUNITY): Payer: Self-pay | Admitting: Family Medicine

## 2014-12-03 ENCOUNTER — Emergency Department (HOSPITAL_COMMUNITY)
Admission: EM | Admit: 2014-12-03 | Discharge: 2014-12-03 | Disposition: A | Discharge status: Home / Self Care | Payer: BLUE CROSS/BLUE SHIELD | Attending: Emergency Medicine | Admitting: Emergency Medicine

## 2014-12-03 DIAGNOSIS — Z7982 Long term (current) use of aspirin: Secondary | ICD-10-CM | POA: Diagnosis not present

## 2014-12-03 DIAGNOSIS — E669 Obesity, unspecified: Secondary | ICD-10-CM | POA: Diagnosis not present

## 2014-12-03 DIAGNOSIS — E785 Hyperlipidemia, unspecified: Secondary | ICD-10-CM | POA: Diagnosis not present

## 2014-12-03 DIAGNOSIS — E119 Type 2 diabetes mellitus without complications: Secondary | ICD-10-CM | POA: Insufficient documentation

## 2014-12-03 DIAGNOSIS — I2510 Atherosclerotic heart disease of native coronary artery without angina pectoris: Secondary | ICD-10-CM | POA: Diagnosis not present

## 2014-12-03 DIAGNOSIS — L03011 Cellulitis of right finger: Secondary | ICD-10-CM | POA: Diagnosis not present

## 2014-12-03 DIAGNOSIS — Z87438 Personal history of other diseases of male genital organs: Secondary | ICD-10-CM | POA: Insufficient documentation

## 2014-12-03 DIAGNOSIS — I10 Essential (primary) hypertension: Secondary | ICD-10-CM | POA: Diagnosis not present

## 2014-12-03 DIAGNOSIS — Z79899 Other long term (current) drug therapy: Secondary | ICD-10-CM | POA: Diagnosis not present

## 2014-12-03 DIAGNOSIS — IMO0001 Reserved for inherently not codable concepts without codable children: Secondary | ICD-10-CM

## 2014-12-03 DIAGNOSIS — Z9889 Other specified postprocedural states: Secondary | ICD-10-CM | POA: Insufficient documentation

## 2014-12-03 DIAGNOSIS — Z720 Tobacco use: Secondary | ICD-10-CM | POA: Insufficient documentation

## 2014-12-03 DIAGNOSIS — Z8546 Personal history of malignant neoplasm of prostate: Secondary | ICD-10-CM | POA: Insufficient documentation

## 2014-12-03 DIAGNOSIS — Z880 Allergy status to penicillin: Secondary | ICD-10-CM | POA: Diagnosis not present

## 2014-12-03 DIAGNOSIS — M79641 Pain in right hand: Secondary | ICD-10-CM | POA: Diagnosis present

## 2014-12-03 MED ORDER — CLINDAMYCIN HCL 300 MG PO CAPS: ORAL_CAPSULE | ORAL | Status: DC

## 2014-12-03 MED ORDER — IBUPROFEN 400 MG PO TABS
600.0000 mg | ORAL_TABLET | Freq: Once | ORAL | Status: AC
  Administered 2014-12-03: 600 mg via ORAL
  Filled 2014-12-03: qty 3

## 2014-12-03 NOTE — ED Notes (Signed)
Pt has swelling to right middle finger. sts possible cuticle infection. sts he bites nails. sts also he remembers his dog nicked finger with tooth. sts shots up to date. Swelling at tip of the finger.
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
## 2014-12-03 NOTE — Discharge Instructions (Signed)
Return to the emergency room with worsening of symptoms, new symptoms or with symptoms that are concerning, especially swelling, redness, red streaks, fevers, generalized ill feeling, increased pus drainage Please take all of your antibiotics until finished!   You may develop abdominal discomfort or diarrhea from the antibiotic.  You may help offset this with probiotics which you can buy or get in yogurt. Do not eat  or take the probiotics until 2 hours after your antibiotic.  Read below information and follow recommendations.  Paronychia Paronychia is an inflammatory reaction involving the folds of the skin surrounding the fingernail. This is commonly caused by an infection in the skin around a nail. The most common cause of paronychia is frequent wetting of the hands (as seen with bartenders, food servers, nurses or others who wet their hands). This makes the skin around the fingernail susceptible to infection by bacteria (germs) or fungus. Other predisposing factors are:  Aggressive manicuring.  Nail biting.  Thumb sucking. The most common cause is a staphylococcal (a type of germ) infection, or a fungal (Candida) infection. When caused by a germ, it usually comes on suddenly with redness, swelling, pus and is often painful. It may get under the nail and form an abscess (collection of pus), or form an abscess around the nail. If the nail itself is infected with a fungus, the treatment is usually prolonged and may require oral medicine for up to one year. Your caregiver will determine the length of time treatment is required. The paronychia caused by bacteria (germs) may largely be avoided by not pulling on hangnails or picking at cuticles. When the infection occurs at the tips of the finger it is called felon. When the cause of paronychia is from the herpes simplex virus (HSV) it is called herpetic whitlow. TREATMENT  When an abscess is present treatment is often incision and drainage. This means  that the abscess must be cut open so the pus can get out. When this is done, the following home care instructions should be followed. HOME CARE INSTRUCTIONS   It is important to keep the affected fingers very dry. Rubber or plastic gloves over cotton gloves should be used whenever the hand must be placed in water.  Keep wound clean, dry and dressed as suggested by your caregiver between warm soaks or warm compresses.  Soak in warm water for fifteen to twenty minutes three to four times per day for bacterial infections. Fungal infections are very difficult to treat, so often require treatment for long periods of time.  For bacterial (germ) infections take antibiotics (medicine which kill germs) as directed and finish the prescription, even if the problem appears to be solved before the medicine is gone.  Only take over-the-counter or prescription medicines for pain, discomfort, or fever as directed by your caregiver. SEEK IMMEDIATE MEDICAL CARE IF:  You have redness, swelling, or increasing pain in the wound.  You notice pus coming from the wound.  You have a fever.  You notice a bad smell coming from the wound or dressing. Document Released: 02/24/2001 Document Revised: 11/23/2011 Document Reviewed: 10/26/2008 [facility] Patient Information 2015 [place], [place]. This information is not intended to replace advice given to you by your health care provider. Make sure you discuss any questions you have with your health care provider.

## 2015-03-07 ENCOUNTER — Other Ambulatory Visit: Payer: Self-pay

## 2015-03-07 MED ORDER — AMLODIPINE BESYLATE 10 MG PO TABS: 10.0000 mg | ORAL_TABLET | Freq: Every day | ORAL | Status: DC

## 2015-03-07 MED ORDER — ATORVASTATIN CALCIUM 80 MG PO TABS: 80.0000 mg | ORAL_TABLET | Freq: Every day | ORAL | Status: DC

## 2015-03-07 MED ORDER — OMEGA-3-ACID ETHYL ESTERS 1 G PO CAPS: 2.0000 g | ORAL_CAPSULE | Freq: Every day | ORAL | Status: DC

## 2022-02-25 DIAGNOSIS — H21541 Posterior synechiae (iris), right eye: Secondary | ICD-10-CM | POA: Insufficient documentation

## 2022-02-25 DIAGNOSIS — H3521 Other non-diabetic proliferative retinopathy, right eye: Secondary | ICD-10-CM | POA: Insufficient documentation

## 2022-02-25 DIAGNOSIS — H33001 Unspecified retinal detachment with retinal break, right eye: Secondary | ICD-10-CM | POA: Insufficient documentation

## 2022-04-15 DIAGNOSIS — Z9889 Other specified postprocedural states: Secondary | ICD-10-CM | POA: Insufficient documentation

## 2022-06-10 DIAGNOSIS — H2141 Pupillary membranes, right eye: Secondary | ICD-10-CM | POA: Insufficient documentation

## 2022-06-10 DIAGNOSIS — H2701 Aphakia, right eye: Secondary | ICD-10-CM | POA: Insufficient documentation

## 2022-09-25 ENCOUNTER — Ambulatory Visit (HOSPITAL_COMMUNITY)
Admission: EM | Admit: 2022-09-25 | Discharge: 2022-09-25 | Disposition: A | Discharge status: Home / Self Care | Payer: Medicaid Other

## 2022-09-25 ENCOUNTER — Encounter (HOSPITAL_COMMUNITY): Payer: Self-pay

## 2022-09-25 DIAGNOSIS — L249 Irritant contact dermatitis, unspecified cause: Secondary | ICD-10-CM

## 2022-09-25 MED ORDER — HYDROCORTISONE 1 % EX CREA: TOPICAL_CREAM | CUTANEOUS | 0 refills | Status: DC

## 2022-09-25 MED ORDER — HYDROCORTISONE 1 % EX CREA: TOPICAL_CREAM | CUTANEOUS | 0 refills | Status: AC

## 2022-09-25 NOTE — Discharge Instructions (Addendum)
I have sent hydrocortisone cream to the pharmacy, place this on the affected area 2 times daily, please do not use this medication for more than 4 weeks.   Please return for follow up if symptoms are not improving.   I have attached information for a PCP, please call and schedule an appointment to establish care.
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
## 2022-09-25 NOTE — ED Triage Notes (Signed)
Pt c/o a red raised area to lt side of chest/under arm x2 days with a burning feeling and hot to touch. Denies taken any meds.

## 2022-10-02 ENCOUNTER — Emergency Department (HOSPITAL_COMMUNITY)
Admission: EM | Admit: 2022-10-02 | Discharge: 2022-10-02 | Disposition: A | Discharge status: Home / Self Care | Payer: Medicaid Other | Attending: Emergency Medicine | Admitting: Emergency Medicine

## 2022-10-02 ENCOUNTER — Ambulatory Visit: Payer: Self-pay

## 2022-10-02 ENCOUNTER — Emergency Department (HOSPITAL_COMMUNITY): Payer: Medicaid Other

## 2022-10-02 DIAGNOSIS — Z794 Long term (current) use of insulin: Secondary | ICD-10-CM | POA: Diagnosis not present

## 2022-10-02 DIAGNOSIS — M79672 Pain in left foot: Secondary | ICD-10-CM | POA: Diagnosis not present

## 2022-10-02 DIAGNOSIS — Z7982 Long term (current) use of aspirin: Secondary | ICD-10-CM | POA: Diagnosis not present

## 2022-10-02 DIAGNOSIS — E119 Type 2 diabetes mellitus without complications: Secondary | ICD-10-CM | POA: Diagnosis not present

## 2022-10-02 LAB — CBC WITH DIFFERENTIAL/PLATELET
Abs Immature Granulocytes: 0.01 10*3/uL (ref 0.00–0.07)
Basophils Absolute: 0 10*3/uL (ref 0.0–0.1)
Basophils Relative: 1 %
Eosinophils Absolute: 0.2 10*3/uL (ref 0.0–0.5)
Eosinophils Relative: 4 %
HCT: 39.7 % (ref 39.0–52.0)
Hemoglobin: 13.1 g/dL (ref 13.0–17.0)
Immature Granulocytes: 0 %
Lymphocytes Relative: 34 %
Lymphs Abs: 1.9 10*3/uL (ref 0.7–4.0)
MCH: 27.6 pg (ref 26.0–34.0)
MCHC: 33 g/dL (ref 30.0–36.0)
MCV: 83.6 fL (ref 80.0–100.0)
Monocytes Absolute: 0.3 10*3/uL (ref 0.1–1.0)
Monocytes Relative: 6 %
Neutro Abs: 3 10*3/uL (ref 1.7–7.7)
Neutrophils Relative %: 55 %
Platelets: 272 10*3/uL (ref 150–400)
RBC: 4.75 MIL/uL (ref 4.22–5.81)
RDW: 15.3 % (ref 11.5–15.5)
WBC: 5.5 10*3/uL (ref 4.0–10.5)
nRBC: 0 % (ref 0.0–0.2)

## 2022-10-02 LAB — BASIC METABOLIC PANEL
Anion gap: 6 (ref 5–15)
BUN: 8 mg/dL (ref 6–20)
CO2: 26 mmol/L (ref 22–32)
Calcium: 9 mg/dL (ref 8.9–10.3)
Chloride: 104 mmol/L (ref 98–111)
Creatinine, Ser: 0.9 mg/dL (ref 0.61–1.24)
GFR, Estimated: >60 mL/min (ref >60)
Glucose, Bld: 298 mg/dL — ABNORMAL HIGH (ref 70–99)
Potassium: 3.5 mmol/L (ref 3.5–5.1)
Sodium: 136 mmol/L (ref 135–145)

## 2022-10-02 MED ORDER — GABAPENTIN 300 MG PO CAPS: 300.0000 mg | ORAL_CAPSULE | Freq: Three times a day (TID) | ORAL | 0 refills | Status: DC

## 2022-10-02 NOTE — ED Provider Notes (Signed)
[facility] Provider Note   CSN: [id number] Arrival date & time: 10/02/22  1116     History  Chief Complaint  Patient presents with   Peripheral Neuropathy    Thomas Watts is a 63 y.o. male.  62 year old male with history of diabetes presents with complaint of pain in his foot.  Described as a burning and tingling sensation, bothered by the lightest touch including the sheets on his bed.  Believes he has neuropathy, has not been formally diagnosed.  Has tried to set up an appointment with PCP but unable to be seen until April.  Denies any injuries to the foot.  No other complaints or concerns today.       Home Medications Prior to Admission medications   Medication Sig Start Date End Date Taking? Authorizing Provider  gabapentin (NEURONTIN) 300 MG capsule Take 1 capsule (300 mg total) by mouth 3 (three) times daily for 14 days. 10/02/22 10/16/22 Yes Tacy Learn, PA-C  amLODipine (NORVASC) 10 MG tablet Take 1 tablet (10 mg total) by mouth daily. 03/07/15   Martinique, Peter M, MD  aspirin EC 81 MG EC tablet Take 1 tablet (81 mg total) by mouth daily. 07/15/14   Eileen Stanford, PA-C  atorvastatin (LIPITOR) 80 MG tablet Take 1 tablet (80 mg total) by mouth daily at 6 PM. 03/07/15   Martinique, Peter M, MD  Dulaglutide (TRULICITY [place]) Inject into the skin.    [provider]  enalapril (VASOTEC) 20 MG tablet Take 1 tablet (20 mg total) by mouth daily. 07/16/14   Martinique, Peter M, MD  hydrocortisone cream 1 % Apply to affected area 2 times daily 09/25/22   Flossie Dibble, NP  insulin aspart protamine - aspart (NOVOLOG 70/30 FLEXPEN) (70-30) 100 UNIT/ML FlexPen Inject 55 Units into the skin daily.    [provider]  metoprolol tartrate (LOPRESSOR) 25 MG tablet Take 1.5 tablets (37.5 mg total) by mouth 2 (two) times daily. 09/19/14   Martin[place], Peter M, MD  nicotine (NICODERM CQ - DOSED IN MG/24 HOURS) 14 mg/24hr patch Place 1  patch (14 mg total) onto the skin daily. 07/15/14   Eileen Stanford, PA-C  nitroGLYCERIN (NITROSTAT) 0.4 MG SL tablet Place 1 tablet (0.4 mg total) under the tongue every 5 (five) minutes x 3 doses as needed for chest pain. 07/16/14   Martinique, Peter M, MD  omega-3 acid ethyl esters (LOVAZA) 1 G capsule Take 2 capsules (2 g total) by mouth daily. 03/07/15   Martinique, Peter M, MD  ticagrelor (BRILINTA) 90 MG TABS tablet Take 1 tablet (90 mg total) by mouth 2 (two) times daily. 10/05/14   Martinique, Peter M, MD      Allergies    Penicillins    Review of Systems   Review of Systems Negative except as per HPI Physical Exam Updated Vital Signs BP (!) 138/93 (BP Location: Right Arm)   Pulse 88   Temp 98.6 F (37 C)   Resp 16   SpO2 96%  Physical Exam Vitals and nursing note reviewed.  Constitutional:      General: He is not in acute distress.    Appearance: He is well-developed. He is not diaphoretic.  HENT:     Head: Normocephalic and atraumatic.  Cardiovascular:     Pulses: Normal pulses.  Pulmonary:     Effort: Pulmonary effort is normal.  Musculoskeletal:        General: Tenderness present. No swelling,  deformity or signs of injury.     Right lower leg: No edema.     Left lower leg: No edema.  Skin:    General: Skin is warm and dry.     Capillary Refill: Capillary refill takes less than 2 seconds.     Findings: No erythema or rash.  Neurological:     Mental Status: He is alert and oriented to person, place, and time.     Sensory: No sensory deficit.     Motor: No weakness.     Gait: Gait normal.  Psychiatric:        Behavior: Behavior normal.     ED Results / Procedures / Treatments   Labs (all labs ordered are listed, but only abnormal results are displayed) Labs Reviewed  BASIC METABOLIC PANEL - Abnormal; Notable for the following components:      Result Value   Glucose, Bld 298 (*)    All other components within normal limits  CBC WITH DIFFERENTIAL/PLATELET     EKG None  Radiology DG Foot Complete Left  Result Date: 10/02/2022 CLINICAL DATA:  Pain EXAM: LEFT FOOT - COMPLETE 3 VIEW COMPARISON:  None Available. FINDINGS: There is no evidence of fracture or dislocation. There is no evidence of arthropathy or other focal bone abnormality. Soft tissues are unremarkable. IMPRESSION: Negative. Electronically Signed   By: Sammie Bench M.D.   On: 10/02/2022 14:45    Procedures Procedures    Medications Ordered in ED Medications - No data to display  ED Course/ Medical Decision Making/ A&P                             Medical Decision Making Amount and/or Complexity of Data Reviewed Radiology: ordered.  Risk Prescription drug management.   62 year old male with diabetes presents with concern for pain in the left foot.  The foot is well-appearing and normal on exam, skin is intact, DP pulse present, sensation intact, brisk cap refill present in each toe.  He reports pain with palpation to the distal plantar surface of the foot and into the toes.  Labs reviewed, found to have elevated blood glucose without evidence of DKA.  Plan is to obtain an x-ray, if negative for acute bony abnormality, plan is to discharge with referral to PCP, podiatry, prescription for gabapentin.  XR of the left foot as ordered and interpreted by myself as negative for acute bony abnormality. Agree with radiologist interpretation.         Final Clinical Impression(s) / ED Diagnoses Final diagnoses:  Left foot pain    Rx / DC Orders ED Discharge Orders          Ordered    gabapentin (NEURONTIN) 300 MG capsule  3 times daily        10/02/22 1423              Tacy Learn, PA-C 10/02/22 1452    Fredia Sorrow, MD 10/02/22 1530

## 2022-10-02 NOTE — ED Provider Triage Note (Signed)
Emergency Medicine Provider Triage Evaluation Note  Thomas Watts , a 62 y.o. male  was evaluated in triage.  Pt complains of left foot burning/tingling type sensation.  Patient with history of diabetic neuropathy and recently got out of prison.  States he tried to set up care with a primary care but has been unable to until April.  Is taking no medication for this currently.  Denies any known injury/trauma to affected foot.  Area of concern is on the pad of left foot..  Review of Systems  Positive: See above Negative:   Physical Exam  BP (!) 138/93 (BP Location: Right Arm)   Pulse 88   Temp 98.6 F (37 C)   Resp 16   SpO2 96%  Gen:   Awake, no distress   Resp:  Normal effort  MSK:   Moves extremities without difficulty  Other:  No obvious overlying skin abnormalities.  Palpable pedal pulses.  No bony tenderness to palpation.  Medical Decision Making  Medically screening exam initiated at 12:57 PM.  Appropriate orders placed.  Thomas Watts was informed that the remainder of the evaluation will be completed by another provider, this initial triage assessment does not replace that evaluation, and the importance of remaining in the ED until their evaluation is complete.     Wilnette Kales, [place] 10/02/22 1257

## 2022-10-02 NOTE — Telephone Encounter (Signed)
     Chief Complaint: Foot pain, left foot is worse Symptoms: Tingling Frequency: Several months, getting worse Pertinent Negatives: Patient denies  Disposition: '[]'$ ED /'[]'$ Urgent Care (no appt availability in office) / '[]'$ Appointment(In office/virtual)/ '[]'$  [facility]/ '[]'$ Home Care/ '[]'$ Refused Recommended Disposition /'[]'$ [facility]/ '[]'$  Follow-up with PCP Additional Notes: Pt. Needs PCP. Given [facility] phone numbers. Will call and make appointment.  Answer Assessment - Initial Assessment Questions 1. ONSET: "When did the pain start?"      Several months, getting worse 2. LOCATION: "Where is the pain located?"      Left foot 3. PAIN: "How bad is the pain?"    (Scale 1-10; or mild, moderate, severe)  - MILD (1-3): doesn't interfere with normal activities.   - MODERATE (4-7): interferes with normal activities (e.g., work or school) or awakens from sleep, limping.   - SEVERE (8-10): excruciating pain, unable to do any normal activities, unable to walk.      10 4. WORK OR EXERCISE: "Has there been any recent work or exercise that involved this part of the body?"      No 5. CAUSE: "What do you think is causing the foot pain?"     Unsure 6. OTHER SYMPTOMS: "Do you have any other symptoms?" (e.g., leg pain, rash, fever, numbness)     Tingling 7. PREGNANCY: "Is there any chance you are pregnant?" "When was your last menstrual period?"     N/a  Protocols used: Foot Pain-A-AH

## 2022-10-02 NOTE — ED Triage Notes (Addendum)
Patient here with complaint of peripheral neuropathy in left foot. Patient states he had a friend that told him about a surgery that could help pain. Patient here with request for help with pain.

## 2022-10-02 NOTE — Discharge Instructions (Signed)
Can trial gabapentin as prescribed.  You will need to follow-up with podiatry or PCP for refills of this medication.  Provided with information for [facility] as well as [facility], can call to see if they have any earlier appointments available.  Provided with information for podiatry, call to schedule this appointment.

## 2022-10-05 ENCOUNTER — Other Ambulatory Visit: Payer: Self-pay

## 2022-10-05 ENCOUNTER — Emergency Department (HOSPITAL_COMMUNITY)
Admission: EM | Admit: 2022-10-05 | Discharge: 2022-10-05 | Disposition: A | Discharge status: Home / Self Care | Payer: Medicaid Other | Attending: Emergency Medicine | Admitting: Emergency Medicine

## 2022-10-05 ENCOUNTER — Encounter (HOSPITAL_COMMUNITY): Payer: Self-pay

## 2022-10-05 DIAGNOSIS — Z794 Long term (current) use of insulin: Secondary | ICD-10-CM | POA: Diagnosis not present

## 2022-10-05 DIAGNOSIS — E1140 Type 2 diabetes mellitus with diabetic neuropathy, unspecified: Secondary | ICD-10-CM | POA: Insufficient documentation

## 2022-10-05 DIAGNOSIS — E1165 Type 2 diabetes mellitus with hyperglycemia: Secondary | ICD-10-CM | POA: Insufficient documentation

## 2022-10-05 DIAGNOSIS — Z760 Encounter for issue of repeat prescription: Secondary | ICD-10-CM | POA: Diagnosis not present

## 2022-10-05 DIAGNOSIS — R739 Hyperglycemia, unspecified: Secondary | ICD-10-CM

## 2022-10-05 DIAGNOSIS — Z7982 Long term (current) use of aspirin: Secondary | ICD-10-CM | POA: Diagnosis not present

## 2022-10-05 LAB — CBG MONITORING, ED: Glucose-Capillary: 344 mg/dL — ABNORMAL HIGH (ref 70–99)

## 2022-10-05 MED ORDER — TRULICITY 4.5 MG/0.5ML ~~LOC~~ SOAJ: 4.5000 mg | SUBCUTANEOUS | 1 refills | Status: DC

## 2022-10-05 MED ORDER — INSULIN GLARGINE 100 UNIT/ML ~~LOC~~ SOLN: SUBCUTANEOUS | 1 refills | Status: DC

## 2022-10-05 NOTE — Discharge Instructions (Addendum)
It was a pleasure taking care of you today!  You will be sent a refill of your prescription for the insulin glargine as well as the Trulicity.  Take these medications as prescribed.  Continue to check your sugar levels at home.  Attached information for the [facility], they should be able to see you for a follow-up appointment as well as medication refill prior to your appointment with your primary care provider in April 2023.  You may also follow-up with the [facility] urgent cares for a medication refill as well.  Return to the emergency department if you experience increasing/worsening symptoms.

## 2022-10-05 NOTE — ED Provider Notes (Signed)
[facility] Provider Note   CSN: [id number] Arrival date & time: 10/05/22  1313     History  Chief Complaint  Patient presents with   Medication Refill    Thomas Watts is a 62 y.o. male with a medical hx of DM, peripheral neuropathy who presents to the Emergency Department complaining of medication refill onset today. He has ran out of his trulicity and glargine x 2 days that he takes for his DM. Appointment with PCP in April 2023. Recently released from prison in December 2023. Denies chest pain, shortness of breath, fever, chills, abdominal pain, nausea, vomiting.    The history is provided by the patient. No language interpreter was used.       Home Medications Prior to Admission medications   Medication Sig Start Date End Date Taking? Authorizing Provider  Dulaglutide (TRULICITY) 4.5 IO/2.7OJ SOPN Inject 4.5 mg as directed once a week. 10/05/22  Yes Sho Salguero A, PA-C  insulin glargine (LANTUS) 100 UNIT/ML injection Inject 55 unit (s) subcutaneously every evening at dinnertime 10/05/22  Yes Adeline Petitfrere A, PA-C  amLODipine (NORVASC) 10 MG tablet Take 1 tablet (10 mg total) by mouth daily. 03/07/15   Martinique, Peter M, MD  aspirin EC 81 MG EC tablet Take 1 tablet (81 mg total) by mouth daily. 07/15/14   Eileen Stanford, PA-C  atorvastatin (LIPITOR) 80 MG tablet Take 1 tablet (80 mg total) by mouth daily at 6 PM. 03/07/15   Martinique, Peter M, MD  enalapril (VASOTEC) 20 MG tablet Take 1 tablet (20 mg total) by mouth daily. 07/16/14   Martinique, Peter M, MD  gabapentin (NEURONTIN) 300 MG capsule Take 1 capsule (300 mg total) by mouth 3 (three) times daily for 14 days. 10/02/22 10/16/22  Tacy Learn, PA-C  hydrocortisone cream 1 % Apply to affected area 2 times daily 09/25/22   Flossie Dibble, NP  insulin aspart protamine - aspart (NOVOLOG 70/30 FLEXPEN) (70-30) 100 UNIT/ML FlexPen Inject 55 Units into the skin daily.    [provider]  metoprolol tartrate (LOPRESSOR) 25 MG tablet Take 1.5 tablets (37.5 mg total) by mouth 2 (two) times daily. 09/19/14   Martinique, Peter M, MD  nicotine (NICODERM CQ - DOSED IN MG/24 HOURS) 14 mg/24hr patch Place 1 patch (14 mg total) onto the skin daily. 07/15/14   Eileen Stanford, PA-C  nitroGLYCERIN (NITROSTAT) 0.4 MG SL tablet Place 1 tablet (0.4 mg total) under the tongue every 5 (five) minutes x 3 doses as needed for chest pain. 07/16/14   Martinique, Peter M, MD  omega-3 acid ethyl esters (LOVAZA) 1 G capsule Take 2 capsules (2 g total) by mouth daily. 03/07/15   Martinique, Peter M, MD  ticagrelor (BRILINTA) 90 MG TABS tablet Take 1 tablet (90 mg total) by mouth 2 (two) times daily. 10/05/14   Martinique, Peter M, MD      Allergies    Penicillins    Review of Systems   Review of Systems  All other systems reviewed and are negative.   Physical Exam Updated Vital Signs BP (!) 145/81   Pulse 90   Temp 98.4 F (36.9 C) (Oral)   Resp 20   Ht '5\' 11"'$  (1.803 m)   Wt 100.2 kg   SpO2 97%   BMI 30.82 kg/m  Physical Exam Vitals and nursing note reviewed.  Constitutional:      General: He is not in acute distress.  Appearance: Normal appearance.  Eyes:     General: No scleral icterus.    Extraocular Movements: Extraocular movements intact.  Cardiovascular:     Rate and Rhythm: Normal rate and regular rhythm.     Pulses: Normal pulses.     Heart sounds: Normal heart sounds.  Pulmonary:     Effort: Pulmonary effort is normal. No respiratory distress.     Breath sounds: Normal breath sounds.  Abdominal:     Palpations: Abdomen is soft. There is no mass.     Tenderness: There is no abdominal tenderness.  Musculoskeletal:        General: Normal range of motion.     Cervical back: Neck supple.  Skin:    General: Skin is warm and dry.     Findings: No rash.  Neurological:     Mental Status: He is alert.     Sensory: Sensation is intact.     Motor: Motor function is intact.   Psychiatric:        Behavior: Behavior normal.     ED Results / Procedures / Treatments   Labs (all labs ordered are listed, but only abnormal results are displayed) Labs Reviewed  CBG MONITORING, ED - Abnormal; Notable for the following components:      Result Value   Glucose-Capillary 344 (*)    All other components within normal limits    EKG None  Radiology No results found.  Procedures Procedures    Medications Ordered in ED Medications - No data to display  ED Course/ Medical Decision Making/ A&P                             Medical Decision Making Risk Prescription drug management.   Pt presents with concerns for medication refill onset 2 days.  Patient has a follow-up appointment with her primary care provider in April 2023. Vital signs, pt afebrile. On exam, pt with no acute findings. Differential diagnosis includes hyperglycemia, HHS, DKA, medication refill.    Additional history obtained:  External records from outside source obtained and reviewed including: Patient was evaluated in the emergency department on 10/02/2022 for concerns for peripheral neuropathy.  At that time had negative labs and imaging.  The CBG at the time noted glucose at 298.  Patient was given a prescription for gabapentin and information for podiatry as well as colonoscopy to help him on this.  Disposition: Presentation suspicious for likely medication refill and hyperglycemia.  Doubt concerns at this time for DKA or HHS. After consideration of the diagnostic results and the patients response to treatment, I feel that the patient would benefit from Discharge home.  Information for [facility] given.  Lantus and Trulicity refills provided today.  Supportive care measures and strict return precautions discussed with patient at bedside. Pt acknowledges and verbalizes understanding. Pt appears safe for discharge. Follow up as indicated in discharge paperwork.     This chart was dictated using voice recognition software, Dragon. Despite the best efforts of this provider to proofread and correct errors, errors may still occur which can change documentation meaning.  Final Clinical Impression(s) / ED Diagnoses Final diagnoses:  Encounter for medication refill  Hyperglycemia    Rx / DC Orders ED Discharge Orders          Ordered    insulin glargine (LANTUS) 100 UNIT/ML injection        10/05/22 1427  Dulaglutide (TRULICITY) 4.5 OM/3.5[facility]  Weekly        10/05/22 1427              Larone Kliethermes A, PA-C 10/05/22 1537    Fredia Sorrow, MD 10/07/22 1908

## 2022-10-05 NOTE — ED Triage Notes (Signed)
Patient just released from prison recently and was taking trulicity and lantus.  Patient reports he was released from here on the 19th and attempted to schedule primary doc appt but can't see him until April.  Patient has been out of DM meds x 2 days.

## 2022-10-22 ENCOUNTER — Encounter: Payer: Self-pay | Admitting: Physician Assistant

## 2022-10-22 ENCOUNTER — Other Ambulatory Visit: Payer: Self-pay | Admitting: Physician Assistant

## 2022-10-22 ENCOUNTER — Encounter (INDEPENDENT_AMBULATORY_CARE_PROVIDER_SITE_OTHER): Payer: PRIVATE HEALTH INSURANCE | Admitting: Primary Care

## 2022-10-22 ENCOUNTER — Encounter (INDEPENDENT_AMBULATORY_CARE_PROVIDER_SITE_OTHER): Payer: Self-pay | Admitting: Primary Care

## 2022-10-22 ENCOUNTER — Telehealth: Payer: PRIVATE HEALTH INSURANCE | Admitting: Physician Assistant

## 2022-10-22 VITALS — BP 127/86 | HR 94 | Resp 16 | Ht 71.0 in | Wt 238.4 lb

## 2022-10-22 DIAGNOSIS — E1169 Type 2 diabetes mellitus with other specified complication: Secondary | ICD-10-CM | POA: Diagnosis not present

## 2022-10-22 DIAGNOSIS — E1139 Type 2 diabetes mellitus with other diabetic ophthalmic complication: Secondary | ICD-10-CM

## 2022-10-22 DIAGNOSIS — Z8546 Personal history of malignant neoplasm of prostate: Secondary | ICD-10-CM

## 2022-10-22 DIAGNOSIS — E669 Obesity, unspecified: Secondary | ICD-10-CM

## 2022-10-22 DIAGNOSIS — Z794 Long term (current) use of insulin: Secondary | ICD-10-CM

## 2022-10-22 DIAGNOSIS — I10 Essential (primary) hypertension: Secondary | ICD-10-CM

## 2022-10-22 DIAGNOSIS — Z9889 Other specified postprocedural states: Secondary | ICD-10-CM

## 2022-10-22 DIAGNOSIS — E782 Mixed hyperlipidemia: Secondary | ICD-10-CM

## 2022-10-22 DIAGNOSIS — I252 Old myocardial infarction: Secondary | ICD-10-CM | POA: Diagnosis not present

## 2022-10-22 DIAGNOSIS — M79672 Pain in left foot: Secondary | ICD-10-CM

## 2022-10-22 DIAGNOSIS — Z955 Presence of coronary angioplasty implant and graft: Secondary | ICD-10-CM

## 2022-10-22 LAB — POCT GLYCOSYLATED HEMOGLOBIN (HGB A1C): HbA1c, POC (controlled diabetic range): 8.5 % — AB (ref 0.0–7.0)

## 2022-10-22 MED ORDER — TRUEPLUS 5-BEVEL PEN NEEDLES 32G X 4 MM MISC
1.0000 | Freq: Every day | 11 refills | Status: DC
  Filled 2023-02-13: qty 100, fill #0
  Filled 2023-02-15 (×2): qty 100, 34d supply, fill #0
  Filled 2023-02-19: qty 100, 30d supply, fill #0
  Filled 2023-02-19: qty 100, 90d supply, fill #0
  Filled 2023-02-19 – 2023-02-22 (×2): qty 100, 30d supply, fill #0
  Filled 2023-03-22 – 2023-03-29 (×2): qty 100, 30d supply, fill #1
  Filled 2023-05-01: qty 100, 30d supply, fill #2
  Filled 2023-06-07: qty 100, 30d supply, fill #3
  Filled 2023-06-21 – 2023-07-04 (×3): qty 100, 30d supply, fill #4
  Filled 2023-08-03 – 2023-09-25 (×5): qty 100, 30d supply, fill #5

## 2022-10-22 MED ORDER — GLIPIZIDE 5 MG PO TABS: 5.0000 mg | ORAL_TABLET | Freq: Every day | ORAL | 1 refills | Status: DC

## 2022-10-22 MED ORDER — AMLODIPINE BESYLATE 10 MG PO TABS: 10.0000 mg | ORAL_TABLET | Freq: Every day | ORAL | 1 refills | Status: DC

## 2022-10-22 MED ORDER — CARVEDILOL 12.5 MG PO TABS: 12.5000 mg | ORAL_TABLET | Freq: Every day | ORAL | 1 refills | Status: DC

## 2022-10-22 MED ORDER — METFORMIN HCL ER 500 MG PO TB24: 500.0000 mg | ORAL_TABLET | Freq: Three times a day (TID) | ORAL | 1 refills | Status: DC

## 2022-10-22 MED ORDER — TRULICITY 4.5 MG/0.5ML ~~LOC~~ SOAJ: 4.5000 mg | SUBCUTANEOUS | 1 refills | Status: DC

## 2022-10-22 MED ORDER — LANTUS SOLOSTAR 100 UNIT/ML ~~LOC~~ SOPN: 55.0000 [IU] | PEN_INJECTOR | Freq: Every day | SUBCUTANEOUS | 11 refills | Status: DC

## 2022-10-22 MED ORDER — FREESTYLE LIBRE 3 SENSOR MISC: 1.0000 [IU] | Freq: Every day | 1 refills | Status: DC

## 2022-10-22 MED ORDER — NITROGLYCERIN 0.4 MG SL SUBL
0.4000 mg | SUBLINGUAL_TABLET | SUBLINGUAL | 3 refills | Status: AC | PRN
  Filled 2023-06-24 – 2023-09-28 (×2): qty 25, 1d supply, fill #0

## 2022-10-22 MED ORDER — ASPIRIN 81 MG PO TBEC
81.0000 mg | DELAYED_RELEASE_TABLET | Freq: Every day | ORAL | 2 refills | Status: DC
  Filled 2023-03-29: qty 90, 90d supply, fill #0
  Filled 2023-06-24: qty 30, 30d supply, fill #1
  Filled 2023-07-26: qty 30, 30d supply, fill #2
  Filled 2023-09-07 – 2023-10-15 (×6): qty 30, 30d supply, fill #3

## 2022-10-22 MED ORDER — ATORVASTATIN CALCIUM 80 MG PO TABS: 80.0000 mg | ORAL_TABLET | Freq: Every day | ORAL | 1 refills | Status: DC
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
## 2022-10-22 NOTE — Patient Instructions (Addendum)
Your appointment to have fasting labs completed is on Monday, February 12 at 9 AM at [facility] center.  Your appointment to establish care with Juluis Mire at [facility] is 11/26/22 at 10:30 am  I started the referrals for you to be seen by cardiology, podiatry and urology.  I sent your medications to your pharmacy for you.  I encourage you to check your blood sugar and blood pressure levels at home, keep a written log and have available for all office visits.  We will call you with your lab results when they are available.  Kennieth Rad, PA-C Physician Assistant [facility] [email or web address]   How to Take Your Blood Pressure Blood pressure is a measurement of how strongly your blood is pressing against the walls of your arteries. Arteries are blood vessels that carry blood from your heart throughout your body. Your health care provider takes your blood pressure at each office visit. You can also take your own blood pressure at home with a blood pressure monitor. You may need to take your own blood pressure to: Confirm a diagnosis of high blood pressure (hypertension). Monitor your blood pressure over time. Make sure your blood pressure medicine is working. Supplies needed: Blood pressure monitor. A chair to sit in. This should be a chair where you can sit upright with your back supported. Do not sit on a soft couch or an armchair. Table or desk. Small notebook and pencil or pen. How to prepare To get the most accurate reading, avoid the following for 30 minutes before you check your blood pressure: Drinking caffeine. Drinking alcohol. Eating. Smoking. Exercising. Five minutes before you check your blood pressure: Use the bathroom and urinate so that you have an empty bladder. Sit quietly in a chair. Do not talk. How to take your blood pressure To check your blood pressure,  follow the instructions in the manual that came with your blood pressure monitor. If you have a digital blood pressure monitor, the instructions may be as follows: Sit up straight in a chair. Place your feet on the floor. Do not cross your ankles or legs. Rest your left arm at the level of your heart on a table or desk or on the arm of a chair. Pull up your shirt sleeve. Wrap the blood pressure cuff around the upper part of your left arm, 1 inch (2.5 cm) above your elbow. It is best to wrap the cuff around bare skin. Fit the cuff snugly, but not too tightly, around your arm. You should be able to place only one finger between the cuff and your arm. Position the cord so that it rests in the bend of your elbow. Press the power button. Sit quietly while the cuff inflates and deflates. Read the digital reading on the monitor screen and write the numbers down (record them) in a notebook. Wait 2-3 minutes, then repeat the steps, starting at step 1. What does my blood pressure reading mean? A blood pressure reading consists of a higher number over a lower number. Ideally, your blood pressure should be below 120/80. The first ("top") number is called the systolic pressure. It is a measure of the pressure in your arteries as your heart beats. The second ("bottom") number is called the diastolic pressure. It is a measure of the pressure in your arteries as the heart relaxes. Blood pressure is classified into four stages. The following are the stages for adults who do not have a  short-term serious illness or a chronic condition. Systolic pressure and diastolic pressure are measured in a unit called mm Hg (millimeters of mercury).  Normal Systolic pressure: below 678. Diastolic pressure: below 80. Elevated Systolic pressure: 938-101. Diastolic pressure: below 80. Hypertension stage 1 Systolic pressure: 751-025. Diastolic pressure: 85-27. Hypertension stage 2 Systolic pressure: 782 or above. Diastolic  pressure: 90 or above. You can have elevated blood pressure or hypertension even if only the systolic or only the diastolic number in your reading is higher than normal. Follow these instructions at home: Medicines Take over-the-counter and prescription medicines only as told by your health care provider. Tell your health care provider if you are having any side effects from blood pressure medicine. General instructions Check your blood pressure as often as recommended by your health care provider. Check your blood pressure at the same time every day. Take your monitor to the next appointment with your health care provider to make sure that: You are using it correctly. It provides accurate readings. Understand what your goal blood pressure numbers are. Keep all follow-up visits. This is important. General tips Your health care provider can suggest a reliable monitor that will meet your needs. There are several types of home blood pressure monitors. Choose a monitor that has an arm cuff. Do not choose a monitor that measures your blood pressure from your wrist or finger. Choose a cuff that wraps snugly, not too tight or too loose, around your upper arm. You should be able to fit only one finger between your arm and the cuff. You can buy a blood pressure monitor at most drugstores or online. Where to find more information American Heart Association: www.heart.org Contact a health care provider if: Your blood pressure is consistently high. Your blood pressure is suddenly low. Get help right away if: Your systolic blood pressure is higher than 180. Your diastolic blood pressure is higher than 120. These symptoms may be an emergency. Get help right away. Call 911. Do not wait to see if the symptoms will go away. Do not drive yourself to the hospital. Summary Blood pressure is a measurement of how strongly your blood is pressing against the walls of your arteries. A blood pressure reading  consists of a higher number over a lower number. Ideally, your blood pressure should be below 120/80. Check your blood pressure at the same time every day. Avoid caffeine, alcohol, smoking, and exercise for 30 minutes prior to checking your blood pressure. These agents can affect the accuracy of the blood pressure reading. This information is not intended to replace advice given to you by your health care provider. Make sure you discuss any questions you have with your health care provider. Document Revised: 05/15/2021 Document Reviewed: 05/15/2021 Elsevier Patient Education  [place].

## 2022-10-23 ENCOUNTER — Other Ambulatory Visit: Payer: Self-pay | Admitting: Physician Assistant

## 2022-10-23 DIAGNOSIS — E1139 Type 2 diabetes mellitus with other diabetic ophthalmic complication: Secondary | ICD-10-CM

## 2022-10-23 DIAGNOSIS — I10 Essential (primary) hypertension: Secondary | ICD-10-CM

## 2022-10-23 DIAGNOSIS — E782 Mixed hyperlipidemia: Secondary | ICD-10-CM

## 2022-10-26 ENCOUNTER — Encounter: Payer: Self-pay | Admitting: Cardiology

## 2022-10-26 ENCOUNTER — Other Ambulatory Visit: Payer: Self-pay | Admitting: Physician Assistant

## 2022-10-26 ENCOUNTER — Ambulatory Visit: Payer: PRIVATE HEALTH INSURANCE | Attending: Cardiology | Admitting: Cardiology

## 2022-10-26 ENCOUNTER — Ambulatory Visit: Payer: PRIVATE HEALTH INSURANCE | Attending: Family Medicine

## 2022-10-26 VITALS — BP 128/88 | HR 90 | Ht 71.0 in | Wt 239.0 lb

## 2022-10-26 DIAGNOSIS — R0609 Other forms of dyspnea: Secondary | ICD-10-CM

## 2022-10-26 DIAGNOSIS — Z8546 Personal history of malignant neoplasm of prostate: Secondary | ICD-10-CM

## 2022-10-26 DIAGNOSIS — E782 Mixed hyperlipidemia: Secondary | ICD-10-CM

## 2022-10-26 DIAGNOSIS — E1139 Type 2 diabetes mellitus with other diabetic ophthalmic complication: Secondary | ICD-10-CM

## 2022-10-26 DIAGNOSIS — I2510 Atherosclerotic heart disease of native coronary artery without angina pectoris: Secondary | ICD-10-CM | POA: Diagnosis not present

## 2022-10-26 DIAGNOSIS — R400 Somnolence: Secondary | ICD-10-CM

## 2022-10-26 DIAGNOSIS — Z01812 Encounter for preprocedural laboratory examination: Secondary | ICD-10-CM | POA: Diagnosis not present

## 2022-10-26 NOTE — Patient Instructions (Addendum)
Medication Instructions:  NONE ordered at this time of appointment   *If you need a refill on your cardiac medications before your next appointment, please call your pharmacy*   Lab Work: Your physician recommends that you return for lab work February 13th, 2024 BMET, MAGNESIUM, AND CBC  If you have labs (blood work) drawn today and your tests are completely normal, you will receive your results only by: [place] (if you have MyChart) OR A paper copy in the mail If you have any lab test that is abnormal or we need to change your treatment, we will call you to review the results.   Testing/Procedures: Your physician has recommended that you have a sleep study. This test records several body functions during sleep, including: brain activity, eye movement, oxygen and carbon dioxide blood levels, heart rate and rhythm, breathing rate and rhythm, the flow of air through your mouth and nose, snoring, body muscle movements, and chest and belly movement.  Your physician has requested that you have an echocardiogram. Echocardiography is a painless test that uses sound waves to create images of your heart. It provides your doctor with information about the size and shape of your heart and how well your heart's chambers and valves are working. This procedure takes approximately one hour. There are no restrictions for this procedure. Please do NOT wear cologne, perfume, aftershave, or lotions (deodorant is allowed). Please arrive 15 minutes prior to your appointment time.   [place] A DEPT OF [facility] [id number] [place] [address] Dept: [phone] Loc: [place]  10/26/2022  You are scheduled for a Cardiac Catheterization on Thursday, February 15 with Dr. Shelva Majestic.  1. Please arrive at the [facility] (Main Entrance A) at [facility]: [address] [place], [place] [address] at 8:30 AM (This time is two hours before your procedure to ensure your preparation). Free valet parking service is available.   Special note: Every effort is made to have your procedure done on time. Please understand that emergencies sometimes delay scheduled procedures.  2. Diet: Do not eat solid foods after midnight.  The patient may have clear liquids until 5am upon the day of the procedure.  3. Labs: You will need to have blood drawn on Tuesday, February 13 at [place]  Open: 8am - 5pm (Lunch 12:30 - 1:30)   Phone: [phone]. You do not need to be fasting.  4. Medication instructions in preparation for your procedure:   Contrast Allergy: No   Do not take Diabetes Med Glucophage (Metformin) on the day of the procedure and HOLD 48 HOURS AFTER THE PROCEDURE.  On the morning of your procedure, take your Aspirin 81 mg and any morning medicines NOT listed above.  You may use sips of water.  5. Plan for one night stay--bring personal belongings. 6. Bring a current list of your medications and current insurance cards. 7. You MUST have a responsible person to drive you home. 8. Someone MUST be with you the first 24 hours after you arrive home or your discharge will be delayed. 9. Please wear clothes that are easy to get on and off and wear slip-on shoes.  Thank you for allowing [place] to care for you!   -- [place] [facility]    Follow-Up: At [facility], you and your health needs are our priority.  As part of our continuing mission to provide you with exceptional heart care, we have created designated Provider Care Teams.  These Care Teams include your primary Cardiologist (physician) and Advanced Practice Providers (APPs -  Physician Assistants and Nurse Practitioners) who all work together to provide you with the care you need, when you need it.   Your next appointment:   2  week(s) after cath  Provider:   Berniece Salines, DO

## 2022-10-27 ENCOUNTER — Telehealth: Payer: Self-pay

## 2022-10-27 LAB — CBC WITH DIFFERENTIAL/PLATELET

## 2022-10-27 NOTE — Telephone Encounter (Signed)
Seen pt in the hallway after he got his labs drawn. Pt had some questions. I answered them for him. I called him to reiterated he will need someone to take him home after the cath. He verbalized understanding. He stated "I told you I'm in [place] all alone." Pt made aware the procedure may be cancelled or he may have to stay the night if he does not have a ride." He states "I think I have someone that is reliable. I'll reach out to them to see if they can take me." Pt thanked me for calling him.

## 2022-10-28 ENCOUNTER — Telehealth (HOSPITAL_BASED_OUTPATIENT_CLINIC_OR_DEPARTMENT_OTHER): Payer: Self-pay | Admitting: Licensed Clinical Social Worker

## 2022-10-28 ENCOUNTER — Telehealth: Payer: Self-pay | Admitting: *Deleted

## 2022-10-28 LAB — CBC WITH DIFFERENTIAL/PLATELET
Basophils Absolute: 0 10*3/uL (ref 0.0–0.2)
Basos: 1 %
EOS (ABSOLUTE): 0.2 10*3/uL (ref 0.0–0.4)
Eos: 5 %
Hematocrit: 44.1 % (ref 37.5–51.0)
Hemoglobin: 14.9 g/dL (ref 13.0–17.7)
Immature Grans (Abs): 0 10*3/uL (ref 0.0–0.1)
Immature Granulocytes: 0 %
Lymphocytes Absolute: 2 10*3/uL (ref 0.7–3.1)
Lymphs: 38 %
MCH: 27.7 pg (ref 26.6–33.0)
MCHC: 33.8 g/dL (ref 31.5–35.7)
MCV: 82 fL (ref 79–97)
Monocytes Absolute: 0.4 10*3/uL (ref 0.1–0.9)
Monocytes: 7 %
Neutrophils Absolute: 2.7 10*3/uL (ref 1.4–7.0)
Neutrophils: 49 %
Platelets: 284 10*3/uL (ref 150–450)
RBC: 5.37 x10E6/uL (ref 4.14–5.80)
RDW: 14.9 % (ref 11.6–15.4)
WBC: 5.4 10*3/uL (ref 3.4–10.8)

## 2022-10-28 LAB — COMP. METABOLIC PANEL (12)
AST: 17 IU/L (ref 0–40)
Albumin/Globulin Ratio: 1.4 (ref 1.2–2.2)
Albumin: 4.2 g/dL (ref 3.8–4.9)
Alkaline Phosphatase: 88 IU/L (ref 44–121)
BUN/Creatinine Ratio: 10 (ref 10–24)
BUN: 9 mg/dL (ref 8–27)
Bilirubin Total: 0.4 mg/dL (ref 0.0–1.2)
Calcium: 9.6 mg/dL (ref 8.6–10.2)
Chloride: 102 mmol/L (ref 96–106)
Creatinine, Ser: 0.87 mg/dL (ref 0.76–1.27)
Globulin, Total: 3.1 g/dL (ref 1.5–4.5)
Glucose: 214 mg/dL — ABNORMAL HIGH (ref 70–99)
Potassium: 3.9 mmol/L (ref 3.5–5.2)
Sodium: 139 mmol/L (ref 134–144)
Total Protein: 7.3 g/dL (ref 6.0–8.5)
eGFR: 99 mL/min/{1.73_m2} (ref >59)

## 2022-10-28 LAB — MICROALBUMIN / CREATININE URINE RATIO
Creatinine, Urine: 197 mg/dL
Microalb/Creat Ratio: 46 mg/g creat — ABNORMAL HIGH (ref 0–29)
Microalbumin, Urine: 90.7 ug/mL

## 2022-10-28 LAB — BASIC METABOLIC PANEL
BUN/Creatinine Ratio: 10 (ref 10–24)
BUN: 10 mg/dL (ref 8–27)
CO2: 25 mmol/L (ref 20–29)
Calcium: 9.9 mg/dL (ref 8.6–10.2)
Chloride: 100 mmol/L (ref 96–106)
Creatinine, Ser: 1.01 mg/dL (ref 0.76–1.27)
Glucose: 106 mg/dL — ABNORMAL HIGH (ref 70–99)
Potassium: 3.7 mmol/L (ref 3.5–5.2)
Sodium: 140 mmol/L (ref 134–144)
eGFR: 85 mL/min/{1.73_m2} (ref >59)

## 2022-10-28 LAB — LIPID PANEL
Chol/HDL Ratio: 4.4 ratio (ref 0.0–5.0)
Cholesterol, Total: 184 mg/dL (ref 100–199)
HDL: 42 mg/dL (ref >39)
LDL Chol Calc (NIH): 122 mg/dL — ABNORMAL HIGH (ref 0–99)
Triglycerides: 111 mg/dL (ref 0–149)
VLDL Cholesterol Cal: 20 mg/dL (ref 5–40)

## 2022-10-28 LAB — MAGNESIUM: Magnesium: 2 mg/dL (ref 1.6–2.3)

## 2022-10-28 LAB — PSA: Prostate Specific Ag, Serum: <0.1 ng/mL (ref 0.0–4.0)

## 2022-10-28 NOTE — Telephone Encounter (Signed)
Cardiac Catheterization scheduled at [facility] for: Thursday October 29, 2022 10:30 AM Arrival time and place: [place] Entrance A at: 8:30 AM  Nothing to eat after midnight prior to procedure, clear liquids until 5 AM day of procedure  Medication instructions: -Hold:  Metformin-day of procedure and 48 hours post procedure  Glipizide-AM of procedure  Insulin-AM of procedure/1/2 usual dose HS prior to procedure -Other usual morning medications can be taken with sips of water including aspirin 81 mg.  Confirmed patient has responsible adult to drive home post procedure and be with patient first 24 hours after arriving home.  Patient reports no new symptoms concerning for COVID-19 in the past 10 days.  Reviewed procedure instructions with patient.

## 2022-10-28 NOTE — Progress Notes (Signed)
Heart and Vascular Care Navigation  10/28/2022  Thomas Watts 15-Jan-1961 [id number]  Reason for Referral: transportation challenges Patient is participating in a Managed Medicaid Plan: No, commercial insurance only  Engaged with patient by telephone for initial visit for Heart and Vascular Care Coordination.                                                                                                   Assessment:     LCSW was able to reach pt this morning at [phone]. Introduced self, role, reason for call. Pt confirmed current address on Thomas Watts. He confirmed PCP, insurance is [place] state plan, and that his emergency contact remains his wife Thomas Watts. He shares that usually he ubers to and from appts- he share that he has his assistance for tomorrow "figured out." He denies any issues with costs of living to this writer at this time, encouraged him to reach out if needed. He is open for assistance information about community transportation to be mailed to him.                                   HRT/VAS Care Coordination     Patients Home Cardiology Office [place] Team Social Worker   Social Worker Name: Thomas Watts, [place], [place]   Living arrangements for the past 2 months Single Family Home   Lives with: Self   Patient Current Insurance Coverage Commercial Insurance   Patient Has Concern With Paying Medical Bills No   Does Patient Have Prescription Coverage? Yes   Home Assistive Devices/Equipment None       Social History:                                                                             SDOH Screenings   Food Insecurity: No Food Insecurity (10/28/2022)  Housing: Low Risk  (10/28/2022)  Transportation Needs: Unmet Transportation Needs (10/28/2022)  Utilities: Not At Risk (10/28/2022)  Depression (PHQ2-9): Low Risk  (10/22/2022)  Financial Resource Strain: Low Risk  (10/28/2022)  Tobacco Use: High Risk (10/26/2022)    SDOH  Interventions: Financial Resources:  Financial Strain Interventions: Other (Comment) (pt states no challenges at this time but encouraged him to let our team know if we can be of assistance)  Food Insecurity:  Food Insecurity Interventions: Intervention Not Indicated  Housing Insecurity:  Housing Interventions: Intervention Not Indicated  Transportation:   Transportation Interventions: Other (Comment), Bus Pass Given (mailed pt transportation resources)    Other Care Navigation Interventions:     Provided Pharmacy assistance resources  Pt denied any issues with obtaining or affording medications   Follow-up plan:   LCSW will mail pt my card and transportation resources. Encouraged  him to reach out if any additional issues arise. Pt did call this writer back after assessment regarding length of procedure tomorrow LCSW has shared that it would be roughly 4 hours per RN team at [employer]. I remain available as needed.

## 2022-10-29 ENCOUNTER — Encounter: Payer: PRIVATE HEALTH INSURANCE | Admitting: Urology

## 2022-10-29 ENCOUNTER — Other Ambulatory Visit: Payer: Self-pay

## 2022-10-29 ENCOUNTER — Ambulatory Visit (HOSPITAL_COMMUNITY)
Admission: RE | Admit: 2022-10-29 | Discharge: 2022-10-29 | Disposition: A | Discharge status: Home / Self Care | Payer: BC Managed Care – PPO | Attending: Cardiovascular Disease | Admitting: Cardiovascular Disease

## 2022-10-29 ENCOUNTER — Encounter (HOSPITAL_COMMUNITY)
Admission: RE | Disposition: A | Discharge status: Home / Self Care | Payer: Self-pay | Source: Home / Self Care | Attending: Cardiovascular Disease

## 2022-10-29 DIAGNOSIS — Z7982 Long term (current) use of aspirin: Secondary | ICD-10-CM | POA: Insufficient documentation

## 2022-10-29 DIAGNOSIS — Z79899 Other long term (current) drug therapy: Secondary | ICD-10-CM | POA: Diagnosis not present

## 2022-10-29 DIAGNOSIS — R400 Somnolence: Secondary | ICD-10-CM | POA: Insufficient documentation

## 2022-10-29 DIAGNOSIS — E119 Type 2 diabetes mellitus without complications: Secondary | ICD-10-CM | POA: Diagnosis not present

## 2022-10-29 DIAGNOSIS — I209 Angina pectoris, unspecified: Secondary | ICD-10-CM

## 2022-10-29 DIAGNOSIS — R0609 Other forms of dyspnea: Secondary | ICD-10-CM | POA: Diagnosis not present

## 2022-10-29 DIAGNOSIS — F17210 Nicotine dependence, cigarettes, uncomplicated: Secondary | ICD-10-CM | POA: Diagnosis not present

## 2022-10-29 DIAGNOSIS — I252 Old myocardial infarction: Secondary | ICD-10-CM | POA: Diagnosis not present

## 2022-10-29 DIAGNOSIS — Z7985 Long-term (current) use of injectable non-insulin antidiabetic drugs: Secondary | ICD-10-CM | POA: Insufficient documentation

## 2022-10-29 DIAGNOSIS — I25119 Atherosclerotic heart disease of native coronary artery with unspecified angina pectoris: Secondary | ICD-10-CM | POA: Insufficient documentation

## 2022-10-29 DIAGNOSIS — Z7984 Long term (current) use of oral hypoglycemic drugs: Secondary | ICD-10-CM | POA: Diagnosis not present

## 2022-10-29 DIAGNOSIS — Z955 Presence of coronary angioplasty implant and graft: Secondary | ICD-10-CM | POA: Diagnosis not present

## 2022-10-29 DIAGNOSIS — Z794 Long term (current) use of insulin: Secondary | ICD-10-CM | POA: Diagnosis not present

## 2022-10-29 HISTORY — PX: RIGHT/LEFT HEART CATH AND CORONARY ANGIOGRAPHY: CATH118266

## 2022-10-29 LAB — POCT I-STAT 7, (LYTES, BLD GAS, ICA,H+H)
Acid-base deficit: 1 mmol/L (ref 0.0–2.0)
Bicarbonate: 23.4 mmol/L (ref 20.0–28.0)
Calcium, Ion: 1.29 mmol/L (ref 1.15–1.40)
HCT: 39 % (ref 39.0–52.0)
Hemoglobin: 13.3 g/dL (ref 13.0–17.0)
O2 Saturation: 95 %
Potassium: 3.6 mmol/L (ref 3.5–5.1)
Sodium: 139 mmol/L (ref 135–145)
TCO2: 25 mmol/L (ref 22–32)
pCO2 arterial: 39.3 mmHg (ref 32–48)
pH, Arterial: 7.383 (ref 7.35–7.45)
pO2, Arterial: 78 mmHg — ABNORMAL LOW (ref 83–108)

## 2022-10-29 LAB — POCT I-STAT EG7
Acid-Base Excess: 0 mmol/L (ref 0.0–2.0)
Bicarbonate: 25.6 mmol/L (ref 20.0–28.0)
Calcium, Ion: 1.32 mmol/L (ref 1.15–1.40)
HCT: 38 % — ABNORMAL LOW (ref 39.0–52.0)
Hemoglobin: 12.9 g/dL — ABNORMAL LOW (ref 13.0–17.0)
O2 Saturation: 67 %
Potassium: 3.7 mmol/L (ref 3.5–5.1)
Sodium: 140 mmol/L (ref 135–145)
TCO2: 27 mmol/L (ref 22–32)
pCO2, Ven: 44.5 mmHg (ref 44–60)
pH, Ven: 7.368 (ref 7.25–7.43)
pO2, Ven: 36 mmHg (ref 32–45)

## 2022-10-29 LAB — GLUCOSE, CAPILLARY: Glucose-Capillary: 138 mg/dL — ABNORMAL HIGH (ref 70–99)

## 2022-10-29 SURGERY — RIGHT/LEFT HEART CATH AND CORONARY ANGIOGRAPHY
Anesthesia: LOCAL

## 2022-10-29 MED ORDER — SODIUM CHLORIDE 0.9% FLUSH: 3.0000 mL | INTRAVENOUS | Status: DC | PRN

## 2022-10-29 MED ORDER — ASPIRIN 81 MG PO CHEW: 81.0000 mg | CHEWABLE_TABLET | ORAL | Status: DC

## 2022-10-29 MED ORDER — FENTANYL CITRATE (PF) 100 MCG/2ML IJ SOLN
INTRAMUSCULAR | Status: AC
  Filled 2022-10-29: qty 2

## 2022-10-29 MED ORDER — SODIUM CHLORIDE 0.9% FLUSH: 3.0000 mL | Freq: Two times a day (BID) | INTRAVENOUS | Status: DC

## 2022-10-29 MED ORDER — VERAPAMIL HCL 2.5 MG/ML IV SOLN
INTRAVENOUS | Status: DC | PRN
  Administered 2022-10-29: 10 mL via INTRA_ARTERIAL

## 2022-10-29 MED ORDER — HYDRALAZINE HCL 20 MG/ML IJ SOLN: 10.0000 mg | INTRAMUSCULAR | Status: DC | PRN

## 2022-10-29 MED ORDER — MIDAZOLAM HCL 2 MG/2ML IJ SOLN
INTRAMUSCULAR | Status: DC | PRN
  Administered 2022-10-29: 2 mg via INTRAVENOUS

## 2022-10-29 MED ORDER — SODIUM CHLORIDE 0.9 % IV SOLN: 250.0000 mL | INTRAVENOUS | Status: DC | PRN

## 2022-10-29 MED ORDER — ASPIRIN 81 MG PO CHEW: 81.0000 mg | CHEWABLE_TABLET | Freq: Every day | ORAL | Status: DC

## 2022-10-29 MED ORDER — SODIUM CHLORIDE 0.9 % WEIGHT BASED INFUSION
3.0000 mL/kg/h | INTRAVENOUS | Status: AC
  Administered 2022-10-29: 3 mL/kg/h via INTRAVENOUS

## 2022-10-29 MED ORDER — LIDOCAINE HCL (PF) 1 % IJ SOLN
INTRAMUSCULAR | Status: AC
  Filled 2022-10-29: qty 30

## 2022-10-29 MED ORDER — SODIUM CHLORIDE 0.9 % IV SOLN: INTRAVENOUS | Status: AC

## 2022-10-29 MED ORDER — IOHEXOL 350 MG/ML SOLN
INTRAVENOUS | Status: DC | PRN
  Administered 2022-10-29: 85 mL

## 2022-10-29 MED ORDER — LABETALOL HCL 5 MG/ML IV SOLN: 10.0000 mg | INTRAVENOUS | Status: DC | PRN

## 2022-10-29 MED ORDER — MIDAZOLAM HCL 2 MG/2ML IJ SOLN
INTRAMUSCULAR | Status: AC
  Filled 2022-10-29: qty 2

## 2022-10-29 MED ORDER — DIPHENHYDRAMINE HCL 50 MG/ML IJ SOLN
INTRAMUSCULAR | Status: AC
  Filled 2022-10-29: qty 1

## 2022-10-29 MED ORDER — FENTANYL CITRATE (PF) 100 MCG/2ML IJ SOLN
INTRAMUSCULAR | Status: DC | PRN
  Administered 2022-10-29: 50 ug via INTRAVENOUS

## 2022-10-29 MED ORDER — HEPARIN (PORCINE) IN NACL 1000-0.9 UT/500ML-% IV SOLN
INTRAVENOUS | Status: DC | PRN
  Administered 2022-10-29 (×2): 500 mL

## 2022-10-29 MED ORDER — SODIUM CHLORIDE 0.9 % WEIGHT BASED INFUSION: 1.0000 mL/kg/h | INTRAVENOUS | Status: DC

## 2022-10-29 MED ORDER — DIAZEPAM 5 MG PO TABS: 5.0000 mg | ORAL_TABLET | Freq: Four times a day (QID) | ORAL | Status: DC | PRN

## 2022-10-29 MED ORDER — LIDOCAINE HCL (PF) 1 % IJ SOLN
INTRAMUSCULAR | Status: DC | PRN
  Administered 2022-10-29 (×2): 2 mL
  Administered 2022-10-29: 1 mL

## 2022-10-29 MED ORDER — ONDANSETRON HCL 4 MG/2ML IJ SOLN
INTRAMUSCULAR | Status: DC | PRN
  Administered 2022-10-29: 4 mg via INTRAVENOUS

## 2022-10-29 MED ORDER — ONDANSETRON HCL 4 MG/2ML IJ SOLN: 4.0000 mg | Freq: Four times a day (QID) | INTRAMUSCULAR | Status: DC | PRN

## 2022-10-29 MED ORDER — ACETAMINOPHEN 325 MG PO TABS: 650.0000 mg | ORAL_TABLET | ORAL | Status: DC | PRN

## 2022-10-29 MED ORDER — ONDANSETRON HCL 4 MG/2ML IJ SOLN
INTRAMUSCULAR | Status: AC
  Filled 2022-10-29: qty 2

## 2022-10-29 MED ORDER — HEPARIN SODIUM (PORCINE) 1000 UNIT/ML IJ SOLN
INTRAMUSCULAR | Status: DC | PRN
  Administered 2022-10-29: 5000 [IU] via INTRAVENOUS

## 2022-10-29 MED ORDER — VERAPAMIL HCL 2.5 MG/ML IV SOLN
INTRAVENOUS | Status: AC
  Filled 2022-10-29: qty 2

## 2022-10-29 MED ORDER — DIPHENHYDRAMINE HCL 50 MG/ML IJ SOLN
INTRAMUSCULAR | Status: DC | PRN
  Administered 2022-10-29: 50 mg via INTRAVENOUS

## 2022-10-29 SURGICAL SUPPLY — 14 items
CATH BALLN WEDGE 5F 110CM (CATHETERS) IMPLANT
CATH INFINITI JR4 5F (CATHETERS) IMPLANT
CATH OPTITORQUE TIG 4.0 5F (CATHETERS) IMPLANT
DEVICE RAD COMP TR BAND LRG (VASCULAR PRODUCTS) IMPLANT
GLIDESHEATH SLEND SS 6F .021 (SHEATH) IMPLANT
GUIDEWIRE INQWIRE 1.5J.035X260 (WIRE) IMPLANT
INQWIRE 1.5J .035X260CM (WIRE) ×1
KIT HEART LEFT (KITS) ×1 IMPLANT
PACK CARDIAC CATHETERIZATION (CUSTOM PROCEDURE TRAY) ×1 IMPLANT
SHEATH GLIDE SLENDER 4/5FR (SHEATH) IMPLANT
SHEATH PROBE COVER 6X72 (BAG) IMPLANT
TRANSDUCER W/STOPCOCK (MISCELLANEOUS) ×1 IMPLANT
TUBING CIL FLEX 10 FLL-RA (TUBING) ×1 IMPLANT
WIRE EMERALD 3MM-J .025X260CM (WIRE) IMPLANT
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
## 2022-10-29 NOTE — Progress Notes (Signed)
Client states right arm feels softer, right forearm now measures 33cm

## 2022-10-29 NOTE — Progress Notes (Signed)
Right forearm elevated on pillows, site more soft than earlier

## 2022-10-29 NOTE — Interval H&P Note (Signed)
Cath Lab Visit (complete for each Cath Lab visit)  Clinical Evaluation Leading to the Procedure:   ACS: No.  Non-ACS:    Anginal Classification: CCS III  Anti-ischemic medical therapy: Maximal Therapy (2 or more classes of medications)  Non-Invasive Test Results: No non-invasive testing performed  Prior CABG: No previous CABG      History and Physical Interval Note:  10/29/2022 10:30 AM  Thomas Watts  has presented today for surgery, with the diagnosis of cad.  The various methods of treatment have been discussed with the patient and family. After consideration of risks, benefits and other options for treatment, the patient has consented to  Procedure(s): RIGHT/LEFT HEART CATH AND CORONARY ANGIOGRAPHY (N/A) as a surgical intervention.  The patient's history has been reviewed, patient examined, no change in status, stable for surgery.  I have reviewed the patient's chart and labs.  Questions were answered to the patient's satisfaction.     Shelva Majestic

## 2022-10-29 NOTE — Progress Notes (Signed)
Patient had visit with [facility]
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
## 2022-10-29 NOTE — Progress Notes (Addendum)
On arrival from cath lab, client's right forearm larger and more firm than left forearm; Lilia Pro from cath lab states Dr Claiborne Billings aware and no new orders noted; right forearm measures 34cm and left forearm 31cm; client without c/o pain right forearm even with palpation; fingers on right hand pink and capillary refill less than 3sec; Dr Claiborne Billings notified and he states he had Aaron Edelman in cath lab check client's right forearm; called Ria Comment, NP and notified of swelling and she will be in to see client; Aaron Edelman from cath lab in to see client and states that he feels there is a hematoma and it has not changed and he would not put B/P cuff on right forearm
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
## 2022-10-29 NOTE — Discharge Instructions (Signed)
NO METFORMIN FOR 2 DAYS 

## 2022-10-29 NOTE — Progress Notes (Signed)
Dr Claiborne Billings in to see client and okay to d/c home

## 2022-10-29 NOTE — Progress Notes (Signed)
Asked Dr Claiborne Billings about removing air from band and per Dr Claiborne Billings ok to start removing air from band

## 2022-10-30 ENCOUNTER — Encounter (HOSPITAL_COMMUNITY): Payer: Self-pay | Admitting: Cardiovascular Disease

## 2022-11-05 ENCOUNTER — Ambulatory Visit: Payer: PRIVATE HEALTH INSURANCE | Attending: Physician Assistant | Admitting: Physician Assistant

## 2022-11-05 ENCOUNTER — Encounter: Payer: Self-pay | Admitting: Physician Assistant

## 2022-11-05 VITALS — BP 132/78 | HR 95 | Ht 71.0 in | Wt 240.0 lb

## 2022-11-05 DIAGNOSIS — I2510 Atherosclerotic heart disease of native coronary artery without angina pectoris: Secondary | ICD-10-CM

## 2022-11-05 DIAGNOSIS — E785 Hyperlipidemia, unspecified: Secondary | ICD-10-CM

## 2022-11-05 DIAGNOSIS — S40021A Contusion of right upper arm, initial encounter: Secondary | ICD-10-CM | POA: Diagnosis not present

## 2022-11-05 DIAGNOSIS — I10 Essential (primary) hypertension: Secondary | ICD-10-CM | POA: Diagnosis not present

## 2022-11-05 DIAGNOSIS — E119 Type 2 diabetes mellitus without complications: Secondary | ICD-10-CM

## 2022-11-05 MED ORDER — CARVEDILOL 25 MG PO TABS
25.0000 mg | ORAL_TABLET | Freq: Every day | ORAL | 3 refills | Status: DC
  Filled 2023-01-29: qty 30, 30d supply, fill #0
  Filled 2023-02-13: qty 180, 180d supply, fill #0
  Filled 2023-02-15 (×2): qty 90, 90d supply, fill #0
  Filled 2023-03-12: qty 30, 30d supply, fill #0
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
## 2022-11-05 NOTE — Patient Instructions (Signed)
Medication Instructions:   INCREASE Carvedilol (Coreg) to 25 mg-take 1 tablet by mouth 2 times a day   *If you need a refill on your cardiac medications before your next appointment, please call your pharmacy*  Lab Work: NONE ordered at this time of appointment   If you have labs (blood work) drawn today and your tests are completely normal, you will receive your results only by: [place] (if you have MyChart) OR A paper copy in the mail If you have any lab test that is abnormal or we need to change your treatment, we will call you to review the results.  Testing/Procedures: NONE ordered at this time of appointment   Follow-Up: At [facility], you and your health needs are our priority.  As part of our continuing mission to provide you with exceptional heart care, we have created designated Provider Care Teams.  These Care Teams include your primary Cardiologist (physician) and Advanced Practice Providers (APPs -  Physician Assistants and Nurse Practitioners) who all work together to provide you with the care you need, when you need it.  Your next appointment:   3 week(s)  Provider:   APP        Other Instructions

## 2022-11-07 ENCOUNTER — Encounter: Payer: Self-pay | Admitting: Physician Assistant

## 2022-11-09 ENCOUNTER — Telehealth: Payer: Self-pay | Admitting: *Deleted

## 2022-11-09 NOTE — Telephone Encounter (Signed)
Prior Authorization for split night sleep study sent to [facility] via Phone. Per representative no PA is required. Call Reference # [id number].

## 2022-11-11 ENCOUNTER — Encounter: Payer: Self-pay | Admitting: Urology

## 2022-11-11 ENCOUNTER — Ambulatory Visit (INDEPENDENT_AMBULATORY_CARE_PROVIDER_SITE_OTHER): Payer: BC Managed Care – PPO | Admitting: Urology

## 2022-11-11 VITALS — BP 127/83 | HR 90 | Ht 71.0 in | Wt 240.0 lb

## 2022-11-11 DIAGNOSIS — N486 Induration penis plastica: Secondary | ICD-10-CM | POA: Diagnosis not present

## 2022-11-11 DIAGNOSIS — N529 Male erectile dysfunction, unspecified: Secondary | ICD-10-CM | POA: Diagnosis not present

## 2022-11-11 DIAGNOSIS — Z8546 Personal history of malignant neoplasm of prostate: Secondary | ICD-10-CM | POA: Diagnosis not present

## 2022-11-11 DIAGNOSIS — C61 Malignant neoplasm of prostate: Secondary | ICD-10-CM

## 2022-11-11 LAB — URINALYSIS
Bilirubin, UA: NEGATIVE
Blood, UA: NEGATIVE
Glucose, UA: NEGATIVE mg/dL
Ketones, POC UA: NEGATIVE mg/dL
Leukocytes, UA: NEGATIVE
Nitrite, UA: NEGATIVE
Spec Grav, UA: 1.02 (ref 1.010–1.025)
Urobilinogen, UA: 0.2 E.U./dL
pH, UA: 5.5 (ref 5.0–8.0)

## 2022-11-11 MED ORDER — SILDENAFIL CITRATE 20 MG PO TABS
40.0000 mg | ORAL_TABLET | ORAL | 3 refills | Status: DC | PRN
  Filled 2023-01-29: qty 30, 6d supply, fill #0
  Filled 2023-02-13: qty 30, fill #0
  Filled 2023-02-15 (×2): qty 30, 6d supply, fill #0
  Filled 2023-06-21: qty 30, 30d supply, fill #0
  Filled 2023-07-05: qty 30, 15d supply, fill #0
  Filled 2023-07-10 – 2023-07-26 (×3): qty 30, 15d supply, fill #1
  Filled 2023-07-31 – 2023-09-25 (×6): qty 30, 15d supply, fill #2
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
## 2022-11-20 ENCOUNTER — Ambulatory Visit (HOSPITAL_COMMUNITY): Payer: PRIVATE HEALTH INSURANCE | Attending: Cardiology

## 2022-11-20 DIAGNOSIS — R0609 Other forms of dyspnea: Secondary | ICD-10-CM | POA: Insufficient documentation

## 2022-11-20 LAB — ECHOCARDIOGRAM COMPLETE
Area-P 1/2: 4.15 cm2
S' Lateral: 3.4 cm

## 2022-11-24 NOTE — Progress Notes (Unsigned)
Cardiology Clinic Note   Date: 11/25/2022 ID: Leovardo Zwiers, DOB [birth year], MRN [id number]  Primary Cardiologist:  Berniece Salines, DO  Patient Profile    Thomas Watts is a 62 y.o. male who presents to the clinic today for 3-week follow-up for right arm swelling.  Past medical history significant for: CAD. LHC 07/11/2014 (ACS): Mid to distal LAD 30%.  RI 80%.  Mid LCx 80%.  Proximal to mid RCA diffuse disease.  Focal stenosis proximal RCA 95%.  PCI with overlapping DES to proximal to mid RCA. Staged PCI 07/13/2014: PCI with DES to distal LCx. R/LHC 10/29/2022: Proximal to mid CX 100%.  Proximal RCA 25%.  D1 50%.  Distal RCA extensively collateralizing the AV groove and large OM branch of the LCx.  Circumflex branches also collateralized by distal LAD.  Relatively normal right heart pressures with mean PA pressure 16 mmHg.  Recommend medical therapy. Echo 11/20/2022: EF 50 to 55%.  Mild asymmetric LVH of the basal-septal segment.  Grade I DD.  Aortic valve sclerosis/calcification without stenosis. NSVT. Hypertension. Hyperlipidemia. Lipid panel 10/26/2022: LDL 122, HDL 42, TG 111, total 184. T2DM. Tobacco abuse/marijuana use. Patient quit 7 years ago. Prostate cancer.   History of Present Illness    Torell Cardena patient was first evaluated by cardiology during hospitalization on 07/11/2014.  At that time he underwent PCI with overlapping DES to proximal to mid RCA.  He returned to the Cath Lab 2 days later for staged PCI with DES to distal LCx.  He was not evaluated in the office again until he saw Dr. Harriet Masson 10/26/2022 for complaints of chest discomfort and shortness of breath.  R/LHC showed multivessel CAD with collateralization (as detailed above) and relatively normal right heart pressures.  Medical therapy was recommended.  It appears patient was noncompliant with medications and continued to smoke.  Aggressive risk factor modification was recommended.  Echo showed EF 50 to 55%,  Grade I DD (detailed above).  Patient was last seen in the office by Almyra Deforest, PA-C on 11/05/2022 for catheterization follow-up.  At that time he reported significant right arm swelling.  He had a good pulse and there was no sign of compartment syndrome.  Significant bruising was notated right distal arm consistent with ecchymosis and hematoma.  He was instructed to continue monitoring his arm and return in 3 weeks.  Carvedilol was increased to 25 mg twice daily.  Today, patient reports he is doing well. The bruising has resolved and he does not have any more swelling or pain in his right arm. He states he is getting back into a walking program. He reports he walked for about an hour yesterday for the first time in a couple of months and he felt a little bit of angina and increased fatigue. He wonders if he did too much too fast. Other than yesterday, patient denies shortness of breath or dyspnea on exertion. No chest pain, pressure, or tightness. Denies lower extremity edema, orthopnea, or PND. No palpitations.  He requests to go over catheterization results again.  Patient and I viewed catheterization graphic together and went over results and recommendations including risk factor modification.  He is very interested in eating healthy and has been doing a lot of research on the Internet.  He has been restricting sodium.  He no longer smokes cigarettes or marijuana as of 7 years ago.    ROS: All other systems reviewed and are otherwise negative except as noted in History of Present Illness.  Studies  Reviewed    ECG is not ordered today.  Physical Exam    VS:  BP 116/62   Pulse 86   Ht '5\' 11"'$  (1.803 m)   Wt 241 lb 6.4 oz (109.5 kg)   SpO2 93%   BMI 33.67 kg/m  , BMI Body mass index is 33.67 kg/m.  GEN: Well nourished, well developed, in no acute distress. Neck: No JVD or carotid bruits. Cardiac: RRR. No murmurs. No rubs or gallops.   Respiratory:  Respirations regular and unlabored. Clear to  auscultation without rales, wheezing or rhonchi. GI: Soft, nontender, nondistended. Extremities: Radials/DP/PT 2+ and equal bilaterally. No clubbing or cyanosis. No edema.  Right arm without ecchymosis or erythema, soft, nontender to palpation. Skin: Warm and dry, no rash. Neuro: Strength intact.  Assessment & Plan   Right arm swelling.  Patient was evaluated in the office on 11/05/2022 1 week after LHC with complaints of right upper extremity swelling and bruising.  He was found to have a good pulse and no sign of compartment syndrome.  Significant bruising was consistent with ecchymosis and hematoma.  Patient reports bruising has resolved and he no longer has pain or swelling in his right arm.  On exam today, he has 2+ radial pulse.  No ecchymosis noted.  Arm is soft and nontender to palpation. CAD.  S/p PCI with overlapping DES proximal to mid RCA and DES to the distal LCx 2015.  LHC February 2024 showed multivessel CAD with recommendations for medical therapy and aggressive risk factor modification.  Patient reports 1 episode of anginal pain and fatigue after walking for 60 minutes.  He has not done this type of walking for a couple of months.  Symptoms resolved after resting.  He otherwise denies no chest pain, tightness, pressure.  No shortness of breath at rest or DOE.  Discussed scaling back on his walking duration and progressing more slowly.  He will begin walking approximately 20 minutes and increase slowly as tolerated.  He is instructed to call the office if he has increased or sustained episodes of angina.  Continue aspirin, atorvastatin, carvedilol, as needed SL NTG. Hypertension.  BP today 116/62.  Patient denies headaches or dizziness.  Continue amlodipine and carvedilol. Hyperlipidemia.  LDL February 2024 122, not at goal.  Continue atorvastatin.  Patient will need repeat lipid panel and LFTs in early April.  Order placed.  Disposition: Lipid panel and LFTs in April.  Return in 6 months  or sooner as needed.         Signed, Justice Britain. Mickaela Starlin, DNP, NP-C

## 2022-11-25 ENCOUNTER — Ambulatory Visit: Payer: PRIVATE HEALTH INSURANCE | Attending: Student | Admitting: Student

## 2022-11-25 ENCOUNTER — Encounter: Payer: Self-pay | Admitting: Student

## 2022-11-25 VITALS — BP 116/62 | HR 86 | Ht 71.0 in | Wt 241.4 lb

## 2022-11-25 DIAGNOSIS — R600 Localized edema: Secondary | ICD-10-CM

## 2022-11-25 DIAGNOSIS — I2510 Atherosclerotic heart disease of native coronary artery without angina pectoris: Secondary | ICD-10-CM

## 2022-11-25 DIAGNOSIS — E785 Hyperlipidemia, unspecified: Secondary | ICD-10-CM | POA: Diagnosis not present

## 2022-11-25 DIAGNOSIS — I10 Essential (primary) hypertension: Secondary | ICD-10-CM

## 2022-11-25 NOTE — Patient Instructions (Addendum)
Medication Instructions:  Your physician recommends that you continue on your current medications as directed. Please refer to the Current Medication list given to you today.  *If you need a refill on your cardiac medications before your next appointment, please call your pharmacy*   Lab Work: Your physician recommends that you return at the beginning of April to have the following labs drawn: Lipids and Lft's  If you have labs (blood work) drawn today and your tests are completely normal, you will receive your results only by: [place] (if you have MyChart) OR A paper copy in the mail If you have any lab test that is abnormal or we need to change your treatment, we will call you to review the results.   Testing/Procedures: NONE   Follow-Up: At [facility], you and your health needs are our priority.  As part of our continuing mission to provide you with exceptional heart care, we have created designated Provider Care Teams.  These Care Teams include your primary Cardiologist (physician) and Advanced Practice Providers (APPs -  Physician Assistants and Nurse Practitioners) who all work together to provide you with the care you need, when you need it.  We recommend signing up for the patient portal called "MyChart".  Sign up information is provided on this After Visit Summary.  MyChart is used to connect with patients for Virtual Visits (Telemedicine).  Patients are able to view lab/test results, encounter notes, upcoming appointments, etc.  Non-urgent messages can be sent to your provider as well.   To learn more about what you can do with MyChart, go to [email or web address].    Your next appointment:   6 month(s)  Provider:   Berniece Salines, DO     Other Instructions

## 2022-11-26 ENCOUNTER — Encounter (INDEPENDENT_AMBULATORY_CARE_PROVIDER_SITE_OTHER): Payer: Self-pay | Admitting: Primary Care

## 2022-11-26 ENCOUNTER — Ambulatory Visit (INDEPENDENT_AMBULATORY_CARE_PROVIDER_SITE_OTHER): Payer: BC Managed Care – PPO | Admitting: Primary Care

## 2022-11-26 ENCOUNTER — Other Ambulatory Visit (INDEPENDENT_AMBULATORY_CARE_PROVIDER_SITE_OTHER): Payer: Self-pay | Admitting: Primary Care

## 2022-11-26 VITALS — BP 140/84 | HR 93 | Resp 16 | Ht 71.0 in | Wt 240.6 lb

## 2022-11-26 DIAGNOSIS — Z6833 Body mass index (BMI) 33.0-33.9, adult: Secondary | ICD-10-CM | POA: Diagnosis not present

## 2022-11-26 DIAGNOSIS — Z23 Encounter for immunization: Secondary | ICD-10-CM | POA: Diagnosis not present

## 2022-11-26 DIAGNOSIS — I10 Essential (primary) hypertension: Secondary | ICD-10-CM

## 2022-11-26 DIAGNOSIS — Z1159 Encounter for screening for other viral diseases: Secondary | ICD-10-CM

## 2022-11-26 DIAGNOSIS — Z1211 Encounter for screening for malignant neoplasm of colon: Secondary | ICD-10-CM

## 2022-11-26 DIAGNOSIS — E1139 Type 2 diabetes mellitus with other diabetic ophthalmic complication: Secondary | ICD-10-CM

## 2022-11-26 DIAGNOSIS — E6609 Other obesity due to excess calories: Secondary | ICD-10-CM

## 2022-11-26 DIAGNOSIS — Z794 Long term (current) use of insulin: Secondary | ICD-10-CM

## 2022-11-26 MED ORDER — LANTUS SOLOSTAR 100 UNIT/ML ~~LOC~~ SOPN: 20.0000 [IU] | PEN_INJECTOR | Freq: Every day | SUBCUTANEOUS | 11 refills | Status: DC

## 2022-11-26 MED ORDER — FREESTYLE LIBRE 3 SENSOR MISC: 1.0000 [IU] | Freq: Every day | 1 refills | Status: DC

## 2022-11-26 MED ORDER — AMLODIPINE BESYLATE 10 MG PO TABS
10.0000 mg | ORAL_TABLET | Freq: Every day | ORAL | 1 refills | Status: DC
  Filled 2023-01-29: qty 30, 30d supply, fill #0
  Filled 2023-02-13 – 2023-03-29 (×5): qty 90, 90d supply, fill #0

## 2022-11-26 MED ORDER — METFORMIN HCL ER 500 MG PO TB24: 500.0000 mg | ORAL_TABLET | Freq: Every day | ORAL | 1 refills | Status: DC

## 2022-11-26 MED ORDER — TRULICITY 4.5 MG/0.5ML ~~LOC~~ SOAJ: 4.5000 mg | SUBCUTANEOUS | 1 refills | Status: DC

## 2022-11-26 NOTE — Progress Notes (Signed)
Established Patient Office Visit  Subjective   Patient ID: Thomas Watts    DOB: Feb 08, 1961  Age: 62 y.o. MRN: [id number]  HPI  Mr. Thomas Watts is a 62 year old obese male who presents today to establish care.  He has a past medical history of type 2 diabetes, Aphakia of right eye and extensive cardiac diagnoses followed by a manage by cardiology Dr. Luz Brazen. Patient has No headache, No chest pain, No abdominal pain - No Nausea, No new weakness tingling or numbness, No Cough - shortness of breath.  He is scheduled at [facility] on March 28 for optometry procedure. 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   Pupillary membrane of right eye 06/10/2022   Status post eye surgery 04/15/2022   Angina pectoris ([place]) 10/29/2022   Resolved Ambulatory Problems    Diagnosis Date Noted   No Resolved Ambulatory Problems   Past Medical History:  Diagnosis Date   Diabetes mellitus type II, controlled ([place])    Erectile dysfunction    Noncompliance    Peyronie disease      ROS  Comprehensive ROS Pertinent positive and negative noted in HPI     Objective:   Blood Pressure (Abnormal) 140/84 (BP Location: Left Arm, Patient Position: Sitting, Cuff Size: Large)   Pulse 93   Respiration 16   Height 5\' 11"  (1.803 m)   Weight 240 lb 9.6 oz (109.1 kg)   Oxygen Saturation 97%   Body Mass Index 33.56 kg/m    Physical Exam General: No apparent  distress. Eyes: Extraocular eye movements intact, Aphakia of right eye   Neck: Supple, trachea midline. Thyroid: No enlargement, mobile without fixation, no tenderness. Cardiovascular: Regular rhythm and rate, no murmur, normal radial pulses. Respiratory: Normal respiratory effort, clear to auscultation. Gastrointestinal: Normal pitch active bowel sounds, nontender abdomen without distention or appreciable hepatomegaly. Musculoskeletal: Normal muscle tone, no tenderness on palpation of tibia, no excessive thoracic kyphosis. Skin: Appropriate warmth, no visible rash. Mental status: Alert, conversant, speech clear, thought logical, appropriate mood and affect, no hallucinations or delusions evident. Hematologic/lymphatic: No cervical adenopathy, no visible ecchymoses.   The ASCVD Risk score (Arnett DK, et al., 2019) failed to calculate for the following reasons:   The systolic blood pressure is missing   Cannot find a previous HDL lab   Cannot find a previous total cholesterol lab    Assessment & Plan:   Thomas Watts was seen today for establish care, diabetes and hypertension.  Diagnoses and all orders for this visit:  Need for immunization against influenza -     Flu Vaccine QUAD 65mo+IM (Fluarix, Fluzone & Alfiuria Quad PF)  Type 2 diabetes mellitus with other ophthalmic complication, with long-term current use of insulin (HCC) A1c a month ago was 8.5 . - educated on lifestyle modifications, including but not limited to diet choices and adding exercise to daily routine.   -     Ambulatory referral  to Ophthalmology -     Dulaglutide (TRULICITY) 4.5 0000000 SOPN; Inject 4.5 mg as directed once a week. -     insulin glargine (LANTUS SOLOSTAR) 100 UNIT/ML Solostar Pen; Inject 20 Units into the skin at bedtime. -     Continuous Blood Gluc Sensor (FREESTYLE LIBRE 3 SENSOR) MISC; 1 Units by Does not apply route daily. Place 1 sensor on the skin every 14 days. Use to check glucose continuously -      metFORMIN (GLUCOPHAGE-XR) 500 MG 24 hr tablet; Take 1 tablet (500 mg total) by mouth daily with breakfast.  Primary hypertension BP goal - <140/90  explained that having normal blood pressure is the goal and medications are helping to get to goal and maintain normal blood pressure. DIET: Limit salt intake, read nutrition labels to check salt content, limit fried and high fatty foods  Avoid using multisymptom OTC cold preparations that generally contain sudafed which can rise BP. Consult with pharmacist on best cold relief products to use for persons with HTN EXERCISE Discussed incorporating exercise such as walking - 30 minutes most days of the week and can do in 10 minute intervals    -     amLODipine (NORVASC) 10 MG tablet; Take 1 tablet (10 mg total) by mouth daily.  Encounter for screening colonoscopy -     Ambulatory referral to Gastroenterology  Encounter for HCV screening test for low risk patient -     HCV Ab w Reflex to Quant PCR  Other orders -     Tdap vaccine greater than or equal to 7yo IM   Class 1 obesity due to excess calories with serious comorbidity and body mass index (BMI) of 33.0 to 33.9 in adult  Obesity is 30-39 indicating an excess in caloric intake or underlining conditions.  That has lead to other co-morbidities. Educated on lifestyle modifications of diet and exercise which may reduce obesity.    Kerin Perna, NP

## 2022-11-26 NOTE — Telephone Encounter (Signed)
Requested medication (s) are due for refill today: see below  Requested medication (s) are on the active medication list: yes  Last refill:  11/26/22 #21m/1  Future visit scheduled: yes  Notes to clinic:    Pharmacy comment: Alternative Requested:NON-FORMULARY.   All Pharmacy Suggested Alternatives:  insulin degludec (TRESIBA FLEXTOUCH) 100 UNIT/ML FlexTouch Pen Insulin Glargine (BASAGLAR KWIKPEN) 100 UNIT/ML Insulin Degludec (TRESIBA) 100 UNIT/ML SOLN       Requested Prescriptions  Pending Prescriptions Disp Refills   TRESIBA FLEXTOUCH 100 UNIT/ML FlexTouch Pen [Pharmacy Med Name: TRESIBA FLEXTOUCH 100 UNIT/ML]  0     Endocrinology:  Diabetes - Insulins Failed - 11/26/2022 11:19 AM      Failed - HBA1C is between 0 and 7.9 and within 180 days    HbA1c, POC (controlled diabetic range)  Date Value Ref Range Status  10/22/2022 8.5 (A) 0.0 - 7.0 % Final         Passed - Valid encounter within last 6 months    Recent Outpatient Visits           Today Need for immunization against influenza   CStallings MBibb NP       Future Appointments             In 2 months EOletta Lamas MMilford Cage NP [facility]  In 6 months [place], [facility]frey Pick [facility]  In 1 year HT[place] MBerlinda Last MD [facility]

## 2022-11-26 NOTE — Patient Instructions (Signed)
Calorie Counting for Weight Loss Calories are units of energy. Your body needs a certain number of calories from food to keep going throughout the day. When you eat or drink more calories than your body needs, your body stores the extra calories mostly as fat. When you eat or drink fewer calories than your body needs, your body burns fat to get the energy it needs. Calorie counting means keeping track of how many calories you eat and drink each day. Calorie counting can be helpful if you need to lose weight. If you eat fewer calories than your body needs, you should lose weight. Ask your health care provider what a healthy weight is for you. For calorie counting to work, you will need to eat the right number of calories each day to lose a healthy amount of weight per week. A dietitian can help you figure out how many calories you need in a day and will suggest ways to reach your calorie goal. A healthy amount of weight to lose each week is usually 1-2 lb (0.5-0.9 kg). This usually means that your daily calorie intake should be reduced by 500-750 calories. Eating 1,200-1,500 calories a day can help most women lose weight. Eating 1,500-1,800 calories a day can help most men lose weight. What do I need to know about calorie counting? Work with your health care provider or dietitian to determine how many calories you should get each day. To meet your daily calorie goal, you will need to: Find out how many calories are in each food that you would like to eat. Try to do this before you eat. Decide how much of the food you plan to eat. Keep a food log. Do this by writing down what you ate and how many calories it had. To successfully lose weight, it is important to balance calorie counting with a healthy lifestyle that includes regular activity. Where do I find calorie information?  The number of calories in a food can be found on a Nutrition Facts label. If a food does not have a Nutrition Facts label, try  to look up the calories online or ask your dietitian for help. Remember that calories are listed per serving. If you choose to have more than one serving of a food, you will have to multiply the calories per serving by the number of servings you plan to eat. For example, the label on a package of bread might say that a serving size is 1 slice and that there are 90 calories in a serving. If you eat 1 slice, you will have eaten 90 calories. If you eat 2 slices, you will have eaten 180 calories. How do I keep a food log? After each time that you eat, record the following in your food log as soon as possible: What you ate. Be sure to include toppings, sauces, and other extras on the food. How much you ate. This can be measured in cups, ounces, or number of items. How many calories were in each food and drink. The total number of calories in the food you ate. Keep your food log near you, such as in a pocket-sized notebook or on an app or website on your mobile phone. Some programs will calculate calories for you and show you how many calories you have left to meet your daily goal. What are some portion-control tips? Know how many calories are in a serving. This will help you know how many servings you can have of a certain   food. Use a measuring cup to measure serving sizes. You could also try weighing out portions on a kitchen scale. With time, you will be able to estimate serving sizes for some foods. Take time to put servings of different foods on your favorite plates or in your favorite bowls and cups so you know what a serving looks like. Try not to eat straight from a food's packaging, such as from a bag or box. Eating straight from the package makes it hard to see how much you are eating and can lead to overeating. Put the amount you would like to eat in a cup or on a plate to make sure you are eating the right portion. Use smaller plates, glasses, and bowls for smaller portions and to prevent  overeating. Try not to multitask. For example, avoid watching TV or using your computer while eating. If it is time to eat, sit down at a table and enjoy your food. This will help you recognize when you are full. It will also help you be more mindful of what and how much you are eating. What are tips for following this plan? Reading food labels Check the calorie count compared with the serving size. The serving size may be smaller than what you are used to eating. Check the source of the calories. Try to choose foods that are high in protein, fiber, and vitamins, and low in saturated fat, trans fat, and sodium. Shopping Read nutrition labels while you shop. This will help you make healthy decisions about which foods to buy. Pay attention to nutrition labels for low-fat or fat-free foods. These foods sometimes have the same number of calories or more calories than the full-fat versions. They also often have added sugar, starch, or salt to make up for flavor that was removed with the fat. Make a grocery list of lower-calorie foods and stick to it. Cooking Try to cook your favorite foods in a healthier way. For example, try baking instead of frying. Use low-fat dairy products. Meal planning Use more fruits and vegetables. One-half of your plate should be fruits and vegetables. Include lean proteins, such as chicken, turkey, and fish. Lifestyle Each week, aim to do one of the following: 150 minutes of moderate exercise, such as walking. 75 minutes of vigorous exercise, such as running. General information Know how many calories are in the foods you eat most often. This will help you calculate calorie counts faster. Find a way of tracking calories that works for you. Get creative. Try different apps or programs if writing down calories does not work for you. What foods should I eat?  Eat nutritious foods. It is better to have a nutritious, high-calorie food, such as an avocado, than a food with  few nutrients, such as a bag of potato chips. Use your calories on foods and drinks that will fill you up and will not leave you hungry soon after eating. Examples of foods that fill you up are nuts and nut butters, vegetables, lean proteins, and high-fiber foods such as whole grains. High-fiber foods are foods with more than 5 g of fiber per serving. Pay attention to calories in drinks. Low-calorie drinks include water and unsweetened drinks. The items listed above may not be a complete list of foods and beverages you can eat. Contact a dietitian for more information. What foods should I limit? Limit foods or drinks that are not good sources of vitamins, minerals, or protein or that are high in unhealthy fats. These   include: Candy. Other sweets. Sodas, specialty coffee drinks, alcohol, and juice. The items listed above may not be a complete list of foods and beverages you should avoid. Contact a dietitian for more information. How do I count calories when eating out? Pay attention to portions. Often, portions are much larger when eating out. Try these tips to keep portions smaller: Consider sharing a meal instead of getting your own. If you get your own meal, eat only half of it. Before you start eating, ask for a container and put half of your meal into it. When available, consider ordering smaller portions from the menu instead of full portions. Pay attention to your food and drink choices. Knowing the way food is cooked and what is included with the meal can help you eat fewer calories. If calories are listed on the menu, choose the lower-calorie options. Choose dishes that include vegetables, fruits, whole grains, low-fat dairy products, and lean proteins. Choose items that are boiled, broiled, grilled, or steamed. Avoid items that are buttered, battered, fried, or served with cream sauce. Items labeled as crispy are usually fried, unless stated otherwise. Choose water, low-fat milk,  unsweetened iced tea, or other drinks without added sugar. If you want an alcoholic beverage, choose a lower-calorie option, such as a glass of wine or light beer. Ask for dressings, sauces, and syrups on the side. These are usually high in calories, so you should limit the amount you eat. If you want a salad, choose a garden salad and ask for grilled meats. Avoid extra toppings such as bacon, cheese, or fried items. Ask for the dressing on the side, or ask for olive oil and vinegar or lemon to use as dressing. Estimate how many servings of a food you are given. Knowing serving sizes will help you be aware of how much food you are eating at restaurants. Where to find more information Centers for Disease Control and Prevention: www.cdc.gov U.[place]. Department of Agriculture: myplate.gov Summary Calorie counting means keeping track of how many calories you eat and drink each day. If you eat fewer calories than your body needs, you should lose weight. A healthy amount of weight to lose per week is usually 1-2 lb (0.5-0.9 kg). This usually means reducing your daily calorie intake by 500-750 calories. The number of calories in a food can be found on a Nutrition Facts label. If a food does not have a Nutrition Facts label, try to look up the calories online or ask your dietitian for help. Use smaller plates, glasses, and bowls for smaller portions and to prevent overeating. Use your calories on foods and drinks that will fill you up and not leave you hungry shortly after a meal. This information is not intended to replace advice given to you by your health care provider. Make sure you discuss any questions you have with your health care provider. Document Revised: 10/12/2019 Document Reviewed: 10/12/2019 Elsevier Patient Education  2023 [employer].  

## 2022-11-27 NOTE — Telephone Encounter (Signed)
Will forward to provider  

## 2022-12-21 ENCOUNTER — Ambulatory Visit (INDEPENDENT_AMBULATORY_CARE_PROVIDER_SITE_OTHER): Payer: Self-pay | Admitting: *Deleted

## 2022-12-21 NOTE — Telephone Encounter (Signed)
  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 Symptoms: It's numb feeling.   No pain.   I've been told I have neuropathy in my foot. Frequency: "A long time now" Pertinent Negatives: Patient denies pain just numbness Disposition: [] ED /[] Urgent Care (no appt availability in office) / [x] Appointment(In office/virtual)/ []  [place] [facility]/ [] Home Care/ [] Refused Recommended Disposition /[] [place] [facility]/ []  Follow-up with PCP Additional Notes: He has an appt. Already for 12/23/2022 with Gwinda Passe, NP.    I asked if he would be ok discussing this with her during that visit and he was fine with that.

## 2022-12-21 NOTE — Telephone Encounter (Signed)
Message from Allen Kell sent at 12/21/2022 11:45 AM EDT  Summary: neuropathy in feet   Pt states that he is having neuropathy in his feet and is wanting to know if there is anything he can take for this issue besides taking medicine. Please advise.          Call History   Type Contact Phone/Fax User  12/21/2022 11:44 AM EDT Phone (Incoming) Thomas Watts, Thomas Watts (Self) [phone] Judie Petit) Allred, Dondra Prader    Reason for Disposition  [1] Tingling in feet AND [2] new or increased  Answer Assessment - Initial Assessment Questions 1. 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 2. LOCATION: "Where is the pain located?"      Left foot 3. PAIN: "How bad is the pain?"    (Scale 1-10; or mild, moderate, severe)  - MILD (1-3): doesn't interfere with normal activities.   - MODERATE (4-7): interferes with normal activities (e.g., work or school) or awakens from sleep, limping.   - SEVERE (8-10): excruciating pain, unable to do any normal activities, unable to walk.      It's numb and irritating.   No pain 4. WORK OR EXERCISE: "Has there been any recent work or exercise that involved this part of the body?"      diabetes 5. CAUSE: "What do you think is causing the foot pain?"     diabetes 6. OTHER SYMPTOMS: "Do you have any other symptoms?" (e.g., leg pain, rash, fever, numbness)     No 7. PREGNANCY: "Is there any chance you are pregnant?" "When was your last menstrual period?"     N/A  Protocols used: Foot Pain-A-AH

## 2022-12-23 ENCOUNTER — Encounter (INDEPENDENT_AMBULATORY_CARE_PROVIDER_SITE_OTHER): Payer: Self-pay | Admitting: Primary Care

## 2022-12-23 ENCOUNTER — Other Ambulatory Visit: Payer: Self-pay

## 2022-12-23 ENCOUNTER — Ambulatory Visit (INDEPENDENT_AMBULATORY_CARE_PROVIDER_SITE_OTHER): Payer: PRIVATE HEALTH INSURANCE | Admitting: Primary Care

## 2022-12-23 VITALS — BP 130/83 | HR 84 | Temp 97.5°F | Resp 20 | Wt 243.0 lb

## 2022-12-23 DIAGNOSIS — E1139 Type 2 diabetes mellitus with other diabetic ophthalmic complication: Secondary | ICD-10-CM

## 2022-12-23 DIAGNOSIS — Z794 Long term (current) use of insulin: Secondary | ICD-10-CM

## 2022-12-23 DIAGNOSIS — E782 Mixed hyperlipidemia: Secondary | ICD-10-CM

## 2022-12-23 DIAGNOSIS — L910 Hypertrophic scar: Secondary | ICD-10-CM | POA: Diagnosis not present

## 2022-12-23 DIAGNOSIS — I10 Essential (primary) hypertension: Secondary | ICD-10-CM

## 2022-12-23 MED ORDER — ATORVASTATIN CALCIUM 80 MG PO TABS
80.0000 mg | ORAL_TABLET | Freq: Every day | ORAL | 1 refills | Status: DC
  Filled 2023-01-29: qty 30, 30d supply, fill #0
  Filled 2023-02-13 – 2023-03-29 (×3): qty 90, 90d supply, fill #0

## 2022-12-23 MED ORDER — TRULICITY 4.5 MG/0.5ML ~~LOC~~ SOAJ: 4.5000 mg | SUBCUTANEOUS | 1 refills | Status: DC

## 2022-12-23 NOTE — Progress Notes (Signed)
Keloid on chest

## 2022-12-23 NOTE — Progress Notes (Signed)
[facility]  [name], is a 62 y.o. male  RFX:[id number]  YME:[id number]  DOB - [birth year]  Chief Complaint  Patient presents with   Hypertension       Subjective:   Thomas Watts is a 62 y.o. male here today for a follow up for the management of HTN. Patient has No headache, No chest pain, No abdominal pain - No Nausea, No new weakness tingling or numbness, No Cough - shortness of breath. He is c/o keloids on his chest that are irritating when clothing is rubbed against his skin. Explain if removed they may return and be larger. 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  He denies polyuria , polyphagia, polydipsia vision changes had surgery 12/10/22.  No problems updated.  Allergies  Allergen Reactions   Ace Inhibitors Swelling   Penicillin G Benzathine & Proc Rash   Penicillins Hives    Hives     Past Medical History:  Diagnosis Date   CAD (coronary artery disease)    a. NSTEMI and repetitive NSVT s/p overlapping DES x2 to [facility] on 07/11/14 and staged PCI w/ DES to [facility] on 07/13/14   Diabetes mellitus type II, controlled    Erectile dysfunction    Hyperlipemia    Hypertension    Marijuana abuse    Noncompliance    NSVT (nonsustained ventricular tachycardia)    a. in the setting of ACS/NSTEMI 07/11/14;  b. 06/2014 Echo: EF 50-55%.   Obesity    Peyronie disease    Prostate CA    a. 11/2013 s/p prostatectomy.   Tobacco abuse     Current Outpatient Medications on File Prior to Visit  Medication Sig Dispense Refill   amLODipine (NORVASC) 10 MG tablet Take 1 tablet (10 mg total) by mouth daily. 90 tablet 1   aspirin EC 81 MG tablet Take 1 tablet (81 mg total) by mouth daily. 90 tablet 2   carvedilol (COREG) 25 MG tablet Take 1 tablet (25 mg total) by mouth daily. 180 tablet 3   Continuous Blood Gluc Sensor (FREESTYLE LIBRE 3 SENSOR) MISC 1 Units by Does not apply route daily. Place 1 sensor on the skin every 14 days. Use to check glucose  continuously 2 each 1   hydrocortisone cream 1 % Apply to affected area 2 times daily 14 g 0   insulin degludec (TRESIBA FLEXTOUCH) 100 UNIT/ML FlexTouch Pen Inject 20 Units into the skin daily. 15 mL 1   Insulin Pen Needle (TRUEPLUS 5-BEVEL PEN NEEDLES) 32G X 4 MM MISC Inject 1 each into the skin daily. 120 each 11   metFORMIN (GLUCOPHAGE-XR) 500 MG 24 hr tablet Take 1 tablet (500 mg total) by mouth daily with breakfast. 90 tablet 1   nitroGLYCERIN (NITROSTAT) 0.4 MG SL tablet Place 1 tablet (0.4 mg total) under the tongue every 5 (five) minutes x 3 doses as needed for chest pain. 25 tablet 3   sildenafil (REVATIO) 20 MG tablet Take 1 tablet (20 mg total) by mouth as needed. Take 2-5 tabs po one to 2 hrs prior to sexual activity on an empty stomach 30 tablet 3   No current facility-administered medications on file prior to visit.    Objective:   Vitals:   12/23/22 0847 12/23/22 0905  BP: (Abnormal) 145/92 130/83  Pulse: 84   Resp: 20   Temp: (Abnormal) 97.5 F (36.4 C)   TempSrc: Oral   SpO2: 100%   Weight: 243 lb (110.2 kg)     Comprehensive  ROS Pertinent positive and negative noted in HPI   Exam General appearance : Awake, alert, not in any distress. Speech Clear. Not toxic looking HEENT: Atraumatic and Normocephalic, pupils equally reactive to light and accomodation Neck: Supple, no JVD. No cervical lymphadenopathy.  Chest: Good air entry bilaterally, no added sounds  CVS: S1 S2 regular, no murmurs.  Abdomen: Bowel sounds present, Non tender and not distended with no gaurding, rigidity or rebound. Extremities: B/L Lower Ext shows no edema, both legs are warm to touch Neurology: Awake alert, and oriented X 3, CN II-XII intact, Non focal Skin: Keloids on chest Data Review Lab Results  Component Value Date   HGBA1C 8.5 (A) 10/22/2022   HGBA1C 7.3 (H) 07/11/2014    Assessment & Plan  Thomas Watts was seen today for hypertension.  Diagnoses and all orders for this  visit:  Mixed hyperlipidemia -     atorvastatin (LIPITOR) 80 MG tablet; Take 1 tablet (80 mg total) by mouth daily at 6 PM.  Type 2 diabetes mellitus with other ophthalmic complication, with long-term current use of insulin  Complications from uncontrolled diabetes -diabetic retinopathy leading to blindness, diabetic nephropathy leading to dialysis, decrease in circulation decrease in sores or wound healing which may lead to amputations and increase of heart attack and stroke  -     Dulaglutide (TRULICITY) 4.5 MG/0.5ML SOPN; Inject 4.5 mg as directed once a week.  Keloid scar  Explain they can be removed however the possibility is greater they return  Primary hypertension  Blood pressure is controlled Dulle medication BP goal - < 130/80 close to goal Explained that having normal blood pressure is the goal and medications are helping to get to goal and maintain normal blood pressure. DIET: Limit salt intake, read nutrition labels to check salt content, limit fried and high fatty foods  Avoid using multisymptom OTC cold preparations that generally contain sudafed which can rise BP. Consult with pharmacist on best cold relief products to use for persons with HTN EXERCISE Discussed incorporating exercise such as walking - 30 minutes most days of the week and can do in 10 minute intervals    Patient have been counseled extensively about nutrition and exercise. Other issues discussed during this visit include: low cholesterol diet, weight control and daily exercise, foot care, annual eye examinations at Ophthalmology, importance of adherence with medications and regular follow-up. We also discussed long term complications of uncontrolled diabetes and hypertension.   Return in about 3 months (around 03/24/2023) for keep scheduled appt..  The patient was given clear instructions to go to ER or return to medical center if symptoms don't improve, worsen or new problems develop. The patient verbalized  understanding. The patient was told to call to get lab results if they haven't heard anything in the next week.   This note has been created with Education officer, environmentalDragon speech recognition software and smart phrase technology. Any transcriptional errors are unintentional.   Grayce SessionsMichelle P Bhakti Labella, NP 12/27/2022, 12:29 AM

## 2022-12-28 LAB — HEMOGLOBIN A1C: Hemoglobin A1C: 8.6

## 2022-12-28 LAB — AMB RESULTS CONSOLE CBG: Glucose: 296

## 2022-12-29 ENCOUNTER — Telehealth: Payer: Self-pay | Admitting: Pharmacist

## 2022-12-29 ENCOUNTER — Other Ambulatory Visit (INDEPENDENT_AMBULATORY_CARE_PROVIDER_SITE_OTHER): Payer: Self-pay | Admitting: Family Medicine

## 2022-12-29 ENCOUNTER — Other Ambulatory Visit: Payer: Self-pay

## 2022-12-29 DIAGNOSIS — E1139 Type 2 diabetes mellitus with other diabetic ophthalmic complication: Secondary | ICD-10-CM

## 2022-12-29 MED ORDER — TRESIBA FLEXTOUCH 100 UNIT/ML ~~LOC~~ SOPN
20.0000 [IU] | PEN_INJECTOR | Freq: Every day | SUBCUTANEOUS | 2 refills | Status: DC
  Filled 2022-12-29: qty 6, 30d supply, fill #0
  Filled 2023-01-29 – 2023-02-15 (×3): qty 6, 30d supply, fill #1
  Filled 2023-02-15: qty 6, 30d supply, fill #0
  Filled 2023-02-15 (×2): qty 6, 30d supply, fill #1

## 2022-12-29 NOTE — Telephone Encounter (Signed)
Pharmacy cannot break boxes of insulin and 1 box provides patient with a 75 day supply. Are we able to initiate a PA for a day supply >30 for him?

## 2022-12-29 NOTE — Telephone Encounter (Signed)
Yes ma'am. Rx sent to our pharmacy.

## 2022-12-29 NOTE — Addendum Note (Signed)
Addended by: Yehuda Savannah L on: 12/29/2022 04:37 PM   Modules accepted: Orders

## 2022-12-30 ENCOUNTER — Other Ambulatory Visit: Payer: Self-pay

## 2023-01-04 ENCOUNTER — Other Ambulatory Visit: Payer: Self-pay

## 2023-01-05 LAB — HM DIABETES EYE EXAM

## 2023-01-13 ENCOUNTER — Telehealth: Payer: Self-pay | Admitting: Primary Care

## 2023-01-13 ENCOUNTER — Other Ambulatory Visit (INDEPENDENT_AMBULATORY_CARE_PROVIDER_SITE_OTHER): Payer: Self-pay | Admitting: Primary Care

## 2023-01-13 ENCOUNTER — Encounter: Payer: Self-pay | Admitting: *Deleted

## 2023-01-13 DIAGNOSIS — E1139 Type 2 diabetes mellitus with other diabetic ophthalmic complication: Secondary | ICD-10-CM

## 2023-01-13 NOTE — Telephone Encounter (Signed)
Copied from CRM [id number]. Topic: General - Call Back - No Documentation >> Jan 12, 2023  4:51 PM Everette C wrote: Reason for CRM: The patient would like to be contacted again when possible

## 2023-01-14 ENCOUNTER — Ambulatory Visit (INDEPENDENT_AMBULATORY_CARE_PROVIDER_SITE_OTHER): Payer: Self-pay

## 2023-01-14 ENCOUNTER — Other Ambulatory Visit: Payer: Self-pay | Admitting: Nurse Practitioner

## 2023-01-14 MED ORDER — OZEMPIC (0.25 OR 0.5 MG/DOSE) 2 MG/3ML ~~LOC~~ SOPN: 0.5000 mg | PEN_INJECTOR | SUBCUTANEOUS | 1 refills | Status: DC

## 2023-01-14 NOTE — Telephone Encounter (Signed)
Could you look at this

## 2023-01-14 NOTE — Telephone Encounter (Signed)
Can we change this to Ozempic? The Trulicity is on backorder.

## 2023-01-14 NOTE — Telephone Encounter (Signed)
Requested medication (s) are due for refill today: yes  Requested medication (s) are on the active medication list: yes  Last refill:  12/23/22  Future visit scheduled: yes  Notes to clinic:  Pharmacy comment: Product Backordered/Unavailable.      Requested Prescriptions  Pending Prescriptions Disp Refills   TRULICITY 4.5 MG/0.5ML SOPN [Pharmacy Med Name: TRULICITY 4.5 MG/0.5 ML PEN]  1    Sig: Inject 4.5 mg as directed once a week.     Endocrinology:  Diabetes - GLP-1 Receptor Agonists Failed - 01/13/2023 11:05 AM      Failed - HBA1C is between 0 and 7.9 and within 180 days    Hemoglobin A1C  Date Value Ref Range Status  12/28/2022 8.6  Final         Passed - Valid encounter within last 6 months    Recent Outpatient Visits           3 weeks ago Keloid scar   [place] [facility] [name], NP   1 month ago Need for immunization against influenza   [place] [facility] [name], NP       Future Appointments             In 1 week Randa Evens Kinnie Scales, NP [place] [facility]   In 2 months Randa Evens, Kinnie Scales, NP [facility]   In 4 months Tobb, Lavona Mound, DO  [facility]Care at [facility]   In 11 months Joline Maxcy, MD [facility] at [facility]

## 2023-01-14 NOTE — Telephone Encounter (Signed)
Increase tresiba to 25 units. After 5 days if blood sugars in the morning fasting are still higher than 130 can increase by 3 units every 3 days until his appt on 01-25-2022

## 2023-01-14 NOTE — Telephone Encounter (Signed)
Pt stated his blood sugar is at 265. Stated his Josephine Igo 3 device is ringing going off.  Pt stated he needs to know what he should do if he should carry his insulin. He stated that he is trying to figure everything out and doesn't know how to contain his sugar.   Stated had pound cake with coffee this morning.   Seeking clinical advice.     Chief Complaint: BS 265 after eating pound cake this morning. Now 255. Compliant with medication regimen. Reviewed home care and when to go to ED. Symptoms: Above Frequency: Today Pertinent Negatives: Patient denies feeling symptoms. "I drank some water and feel better." Disposition: [] ED /[] Urgent Care (no appt availability in office) / [] Appointment(In office/virtual)/ []  [place] [facility]/ [] Home Care/ [] Refused Recommended Disposition /[] [place] [facility]/ [x]  Follow-up with PCP Additional Notes:   Reason for Disposition  [1] Blood glucose 240 - 300 mg/dL (16.1 - 09.6 mmol/L) AND [2] uses insulin (e.g., insulin-dependent, all people with type 1 diabetes)  Answer Assessment - Initial Assessment Questions 1. BLOOD GLUCOSE: "What is your blood glucose level?"      265 2. ONSET: "When did you check the blood glucose?"     Today 3. USUAL RANGE: "What is your glucose level usually?" (e.g., usual fasting morning value, usual evening value)     Under 200 4. KETONES: "Do you check for ketones (urine or blood test strips)?" If Yes, ask: "What does the test show now?"      No 5. TYPE 1 or 2:  "Do you know what type of diabetes you have?"  (e.g., Type 1, Type 2, Gestational; doesn't know)      Type 2 6. INSULIN: "Do you take insulin?" "What type of insulin(s) do you use? What is the mode of delivery? (syringe, pen; injection or pump)?"      Tresiba 7. DIABETES PILLS: "Do you take any pills for your diabetes?" If Yes, ask: "Have you missed taking any pills recently?"     Yes 8. OTHER SYMPTOMS: "Do you have any symptoms?" (e.g., fever, frequent  urination, difficulty breathing, dizziness, weakness, vomiting)     No 9. PREGNANCY: "Is there any chance you are pregnant?" "When was your last menstrual period?"     N/a  Protocols used: Diabetes - High Blood Sugar-A-AH

## 2023-01-14 NOTE — Progress Notes (Signed)
Patient attended screening event on 12/28/22 at [facility], where his BP were 150/86, Glucose 296, and Hemoglobin A1C 8.6. Pt confirmed his PCP is Efrain Sella, NP. Event questionnaires indicated that Pt had food insecurities.  Chart review indicates that Pt has upcoming PCP appointment on 01/26/23 and Cardiology appointment on 05/28/23 with Dr. Servando Salina. Pt results was sent to his PCP and Cardiologist through [employer]. Pt stated that he was aware of his results from screening event but he was concerned about his blood sugar result this morning being closer to 300. Health equity team member informed patient to contact his PCP office to inform them about his Glucose result. Pt indicated he will contact them through my chart since he did not have paper and pen to write down the office number. Due to this response, team member contacted patient's PCP office to inform them about Pt concern regarding his Glucose number. The office front desk informed the health equity team member that one of the triage nurse will contact him with in hour or two due to Pt's PCP not being available at this moment. Triage nurse Asked this team member to relay the message to the patient. Pt was informed that the one of the triage nurse will contact to answer any concern he had. Pt called back to inform the team member that one nurse triage contact him and assisted with his concern. During the call, Pt shared he need Dental resources in his area. Letter sent to Pt with lists of dentists, food pantries, and reminder of upcoming PCP appointment. No additional health equity team support indicated at this time. Lorri Frederick, [place] Guide.

## 2023-01-14 NOTE — Telephone Encounter (Signed)
Will forward to Luke  

## 2023-01-15 ENCOUNTER — Encounter (INDEPENDENT_AMBULATORY_CARE_PROVIDER_SITE_OTHER): Payer: Self-pay

## 2023-01-15 NOTE — Telephone Encounter (Signed)
Looks like pt read MyChart message at 10:40am

## 2023-01-15 NOTE — Telephone Encounter (Signed)
Tried contacting pt and pt didn't answer sent pt a MyChart message

## 2023-01-21 ENCOUNTER — Ambulatory Visit: Payer: BC Managed Care – PPO | Attending: Primary Care | Admitting: Pharmacist

## 2023-01-21 ENCOUNTER — Encounter: Payer: Self-pay | Admitting: Pharmacist

## 2023-01-21 ENCOUNTER — Other Ambulatory Visit: Payer: Self-pay

## 2023-01-21 DIAGNOSIS — E1139 Type 2 diabetes mellitus with other diabetic ophthalmic complication: Secondary | ICD-10-CM | POA: Diagnosis not present

## 2023-01-21 DIAGNOSIS — Z794 Long term (current) use of insulin: Secondary | ICD-10-CM

## 2023-01-21 MED ORDER — METFORMIN HCL ER 500 MG PO TB24
500.0000 mg | ORAL_TABLET | Freq: Every day | ORAL | 1 refills | Status: DC
  Filled 2023-01-29: qty 30, 30d supply, fill #0
  Filled 2023-02-13 – 2023-02-15 (×3): qty 90, 90d supply, fill #0

## 2023-01-21 MED ORDER — OZEMPIC (0.25 OR 0.5 MG/DOSE) 2 MG/3ML ~~LOC~~ SOPN
0.5000 mg | PEN_INJECTOR | SUBCUTANEOUS | 1 refills | Status: DC
  Filled 2023-01-21: qty 3, 28d supply, fill #0
  Filled 2023-01-29 – 2023-02-19 (×3): qty 3, 28d supply, fill #1

## 2023-01-21 MED ORDER — OZEMPIC (0.25 OR 0.5 MG/DOSE) 2 MG/3ML ~~LOC~~ SOPN
0.5000 mg | PEN_INJECTOR | SUBCUTANEOUS | 1 refills | Status: DC
  Filled 2023-01-29 – 2023-02-19 (×6): qty 3, 28d supply, fill #0

## 2023-01-21 MED ORDER — FREESTYLE LIBRE 3 SENSOR MISC
1 refills | Status: DC
  Filled 2023-01-29 – 2023-02-15 (×4): qty 2, 28d supply, fill #0

## 2023-01-21 MED ORDER — GLIPIZIDE ER 5 MG PO TB24
5.0000 mg | ORAL_TABLET | Freq: Every day | ORAL | 1 refills | Status: DC
  Filled 2023-01-29 – 2023-02-15 (×4): qty 30, 30d supply, fill #0

## 2023-01-21 MED ORDER — METFORMIN HCL ER 500 MG PO TB24
500.0000 mg | ORAL_TABLET | Freq: Three times a day (TID) | ORAL | 1 refills | Status: DC
  Filled 2023-01-29 – 2023-02-15 (×3): qty 90, 30d supply, fill #0

## 2023-01-21 MED ORDER — OFLOXACIN 0.3 % OP SOLN
1.0000 [drp] | Freq: Four times a day (QID) | OPHTHALMIC | 1 refills | Status: DC
  Filled 2023-01-29 – 2023-02-19 (×11): qty 5, 25d supply, fill #0

## 2023-01-21 MED ORDER — TRULICITY 4.5 MG/0.5ML ~~LOC~~ SOAJ: 4.5000 mg | SUBCUTANEOUS | 1 refills | Status: DC

## 2023-01-21 NOTE — Progress Notes (Signed)
S:     No chief complaint on file.  62 y.o. male who presents for diabetes evaluation, education, and management. PMH is significant for T2DM complicated by retinopathy, obesity, HTN, CAD S/p NTEMI in 2015, tobacco use. Patient was referred and last seen by Primary Care Provider, Gwinda Passe, on 12/23/2022.   Today, patient arrives in good spirits and presents without any assistance.   Patient reports Diabetes is longstanding. He cannot tolerate metformin. He prefers not to use more insulin than he has to. Pt has clinical ASCVD (CAD, NSTEMI - first in 2015). No CKD, no stroke, no CHF. No pancreatitis or thyroid cancer.  Family/Social History:  - Fhx: stroke, cancer - Tobacco: former smoker  - Alcohol: none reported   Current diabetes medications include: Tresiba 20u daily, metformin 500 mg XR (stopped - cannot tolerate d/t GI side effects), Ozempic (not taking) Current hypertension medications include: amlodipine 10 mg daily, carvedilol 25 mg BID. Of note, he has hx of angioedema to lisinopril  Current hyperlipidemia medications include: atorvastatin 80 mg daily  Patient denies medication adherence. He could not tolerate metformin. He was not able to pick-up and start Ozempic. He is taking 20u daily of [place].   Insurance coverage: BCBS  Patient denies hypoglycemic events.  Patient reports nocturia (nighttime urination).  Patient denies neuropathy (nerve pain). Patient reports visual changes. Patient reports self foot exams.   Patient reported dietary habits:  -Admits to dietary indiscretion. Tries to adhere to a diabetic diet but will occasionally eat fast food, sweets.   Patient-reported exercise habits:  -Walks ~45 minutes daily   O:   Uses Libre 3. The following is over 30 days:   Average Glucose: 270 mg/dL Glucose Management Indicator: 8.6%  Time in Goal:  - Time in range 70-180: 1% - Time above range: 39% 180-249 mg/dL; 16% >109 mg/dL - Time below range:  0% Observed patterns: -Avgs are lower in the 12a-6a (215-245) -Avgs: 270 - 316 from 12p to about 6pm   Lab Results  Component Value Date   HGBA1C 8.6 12/28/2022   There were no vitals filed for this visit.  Lipid Panel     Component Value Date/Time   CHOL 184 10/26/2022 0951   TRIG 111 10/26/2022 0951   HDL 42 10/26/2022 0951   CHOLHDL 4.4 10/26/2022 0951   CHOLHDL 6.6 07/12/2014 0330   VLDL 28 07/12/2014 0330   LDLCALC 122 (H) 10/26/2022 0951    Clinical Atherosclerotic Cardiovascular Disease (ASCVD): Yes  The ASCVD Risk score (Arnett DK, et al., 2019) failed to calculate for the following reasons:   The patient has a prior MI or stroke diagnosis   Patient is participating in a Managed Medicaid Plan: no   A/P: Diabetes longstanding currently uncontrolled based on home CGM data. Patient is able to verbalize appropriate hypoglycemia management plan. Medication adherence appears to be suboptimal. He likely needs more insulin but we will start and titrate Ozempic first. -Continued Tresiba 20u daily.  -Started Ozempic 0.5mg  weekly. Pt was able to tolerate higher doses of Trulicity in the past with no side effects. I sent his Ozempic downstairs for him to pick-up and start as soon as possible.  -Stop metformin d/t GI side effects.  -Extensively discussed pathophysiology of diabetes, recommended lifestyle interventions, dietary effects on blood sugar control.  -Counseled on s/sx of and management of hypoglycemia.  -Next A1c anticipated 03/2023.   Written patient instructions provided. Patient verbalized understanding of treatment plan.  Total time in face  to face counseling 20 minutes.    Follow-up:  Pharmacist in 1 month.  Butch Penny, PharmD, P[place]y [place], CPP Clinical Pharmacist [facility] [phone]

## 2023-01-25 ENCOUNTER — Telehealth: Payer: Self-pay

## 2023-01-25 ENCOUNTER — Ambulatory Visit (INDEPENDENT_AMBULATORY_CARE_PROVIDER_SITE_OTHER): Payer: PRIVATE HEALTH INSURANCE | Admitting: Primary Care

## 2023-01-25 ENCOUNTER — Other Ambulatory Visit: Payer: Self-pay

## 2023-01-25 NOTE — Progress Notes (Signed)
   Thomas Watts [birth year] [id number]  Pt interested in a BP cuff if covered by insurance. Does not want to pay out of pocket.   Patient outreached by Sharman Cheek, PharmD Candidate on 01/25/23 while they were picking up prescriptions at [facility].  Blood Pressure Readings: Last documented ambulatory systolic blood pressure: 150 Last documented ambulatory diastolic blood pressure: 86 Does the patient have a validated home blood pressure machine?: No They report home readings Pt does not have a BP cuff  Medication review was performed. Is the patient taking their medications as prescribed?: Yes   The following barriers to adherence were noted: Does the patient have cost concerns?: No Does the patient have transportation concerns?: No Does the patient need assistance obtaining refills?: No Does the patient have questions or concerns about their medications?: No Does the patient have a follow up scheduled with their primary care provider/cardiologist?: Yes (01/26/23)   Interventions: Interventions Completed: Medications were reviewed, Patient was educated on goal blood pressures and long term health implications of elevated blood pressure  The patient has follow up scheduled:  PCP: [name], NP   Seward Meth, Student-PharmD

## 2023-01-26 ENCOUNTER — Ambulatory Visit (INDEPENDENT_AMBULATORY_CARE_PROVIDER_SITE_OTHER): Payer: BC Managed Care – PPO | Admitting: Primary Care

## 2023-01-26 ENCOUNTER — Encounter (INDEPENDENT_AMBULATORY_CARE_PROVIDER_SITE_OTHER): Payer: Self-pay | Admitting: Primary Care

## 2023-01-26 VITALS — BP 116/79 | HR 78 | Resp 16 | Wt 230.6 lb

## 2023-01-26 DIAGNOSIS — E1139 Type 2 diabetes mellitus with other diabetic ophthalmic complication: Secondary | ICD-10-CM | POA: Diagnosis not present

## 2023-01-26 DIAGNOSIS — Z794 Long term (current) use of insulin: Secondary | ICD-10-CM | POA: Diagnosis not present

## 2023-01-26 DIAGNOSIS — Z1211 Encounter for screening for malignant neoplasm of colon: Secondary | ICD-10-CM

## 2023-01-26 LAB — POCT GLYCOSYLATED HEMOGLOBIN (HGB A1C): HbA1c, POC (controlled diabetic range): 8.7 % — AB (ref 0.0–7.0)

## 2023-01-26 NOTE — Progress Notes (Unsigned)
[facility]  Thomas Watts, is a 62 y.o. male  [id number]  [id number]  DOB - 04-29-1961  Chief Complaint  Patient presents with   Diabetes       Subjective:   [name] is a 62 y.o. male here today for a follow up visit HTN and diabetes. Patient has No headache, No chest pain, No abdominal pain - No Nausea, No new weakness tingling or numbness, No Cough - shortness of breath.  He denies polyuria , polyphagia, polydipsia . He had surgery 12/10/22 for retina detachment. Per patient -Ophthalmologist stated if you get your diabetes under control your vision may return.  Home blood sugars 280 > 300.  States uses french vanilla in his coffee daily.  No problems updated.  Allergies  Allergen Reactions   Ace Inhibitors Swelling   Penicillin G Benzathine & Proc Rash   Penicillins Hives    Hives     Past Medical History:  Diagnosis Date   CAD (coronary artery disease)    a. NSTEMI and repetitive NSVT s/p overlapping DES x2 to [facility] on 07/11/14 and staged PCI w/ DES to LCx on 07/13/14   Diabetes mellitus type II, controlled (HCC)    Erectile dysfunction    Hyperlipemia    Hypertension    Marijuana abuse    Noncompliance    NSVT (nonsustained ventricular tachycardia) (HCC)    a. in the setting of ACS/NSTEMI 07/11/14;  b. 06/2014 Echo: EF 50-55%.   Obesity    Peyronie disease    Prostate CA (HCC)    a. 11/2013 s/p prostatectomy.   Tobacco abuse     Current Outpatient Medications on File Prior to Visit  Medication Sig Dispense Refill   amLODipine (NORVASC) 10 MG tablet Take 1 tablet (10 mg total) by mouth daily. 90 tablet 1   aspirin EC 81 MG tablet Take 1 tablet (81 mg total) by mouth daily. 90 tablet 2   atorvastatin (LIPITOR) 80 MG tablet Take 1 tablet (80 mg total) by mouth daily at 6 PM. 90 tablet 1   carvedilol (COREG) 25 MG tablet Take 1 tablet (25 mg total) by mouth daily. 180 tablet 3   Continuous Blood Gluc Sensor (FREESTYLE LIBRE 3  SENSOR) MISC 1 Units by Does not apply route daily. Place 1 sensor on the skin every 14 days. Use to check glucose continuously 2 each 1   Continuous Glucose Sensor (FREESTYLE LIBRE 3 SENSOR) MISC Place 1 sensor on the skin every 14 days. Use to check glucose continuously. 2 each 1   glipiZIDE (GLUCOTROL XL) 5 MG 24 hr tablet Take 1 tablet (5 mg total) by mouth daily before breakfast. 30 tablet 1   hydrocortisone cream 1 % Apply to affected area 2 times daily 14 g 0   insulin degludec (TRESIBA FLEXTOUCH) 100 UNIT/ML FlexTouch Pen Inject 20 Units into the skin daily. 6 mL 2   Insulin Pen Needle (TRUEPLUS 5-BEVEL PEN NEEDLES) 32G X 4 MM MISC Inject 1 each into the skin daily. 100 each 11   metFORMIN (GLUCOPHAGE-XR) 500 MG 24 hr tablet Take 1 tablet (500 mg total) by mouth daily with breakfast. 90 tablet 1   metFORMIN (GLUCOPHAGE-XR) 500 MG 24 hr tablet Take 1 tablet (500 mg total) by mouth 3 (three) times daily. 90 tablet 1   nitroGLYCERIN (NITROSTAT) 0.4 MG SL tablet Place 1 tablet (0.4 mg total) under the tongue every 5 (five) minutes x 3 doses as needed for chest pain. 25 tablet 3  ofloxacin (OCUFLOX) 0.3 % ophthalmic solution Place 1 drop into the right eye 4 (four) times daily for 7 days starting after surgery 5 mL 1   Semaglutide,0.25 or 0.5MG /DOS, (OZEMPIC, 0.25 OR 0.5 MG/DOSE,) 2 MG/3ML SOPN Inject 0.5 mg into the skin once a week. 3 mL 1   Semaglutide,0.25 or 0.5MG /DOS, (OZEMPIC, 0.25 OR 0.5 MG/DOSE,) 2 MG/3ML SOPN Inject 0.5 mg into the skin once a week. 3 mL 1   sildenafil (REVATIO) 20 MG tablet Take 2-5 tablets (40-100 mg total) by mouth 1-2 hrs prior to sexual activity on an empty stomach. 30 tablet 3   No current facility-administered medications on file prior to visit.    Objective:   Vitals:   01/26/23 1133  BP: 116/79  Pulse: 78  Resp: 16  SpO2: 100%  Weight: 230 lb 9.6 oz (104.6 kg)    Comprehensive ROS Pertinent positive and negative noted in HPI   Exam General  appearance : Awake, alert, not in any distress. Speech Clear. Not toxic looking HEENT: Atraumatic and Normocephalic, pupils equally reactive to light and accomodation Neck: Supple, no JVD. No cervical lymphadenopathy.  Chest: Good air entry bilaterally, no added sounds  CVS: S1 S2 regular, no murmurs.  Abdomen: Bowel sounds present, Non tender and not distended with no gaurding, rigidity or rebound. Extremities: B/L Lower Ext shows no edema, both legs are warm to touch Neurology: Awake alert, and oriented X 3, CN II-XII intact, Non focal Skin: No Rash  Data Review Lab Results  Component Value Date   HGBA1C 8.7 (A) 01/26/2023   HGBA1C 8.6 12/28/2022   HGBA1C 8.5 (A) 10/22/2022    Assessment & Plan  Shyne was seen today for diabetes.  Diagnoses and all orders for this visit:  Type 2 diabetes mellitus with other ophthalmic complication, with long-term current use of insulin (HCC) -     POCT glycosylated hemoglobin (Hb A1C)  Discussed  co- morbidities with uncontrol diabetes  Complications -diabetic retinopathy, (close your eyes ? What do you see nothing) nephropathy decrease in kidney function- can lead to dialysis-on a machine 3 days a week to filter your kidney, neuropathy- numbness and tinging in your hands and feet,  increase risk of heart attack and stroke, and amputation due to decrease wound healing and circulation. Decrease your risk by taking medication daily as prescribed, monitor carbohydrates- foods that are high in carbohydrates are the following rice, potatoes, breads, sugars, and pastas.  Reduction in the intake (eating) will assist in lowering your blood sugars. Exercise daily at least 30 minutes daily.   Encounter for screening colonoscopy -     Ambulatory referral to Gastroenterology      Patient have been counseled extensively about nutrition and exercise. Other issues discussed during this visit include: low cholesterol diet, weight control and daily exercise, foot  care, annual eye examinations at Ophthalmology, importance of adherence with medications and regular follow-up. We also discussed long term complications of uncontrolled diabetes and hypertension.   No follow-ups on file.  The patient was given clear instructions to go to ER or return to medical center if symptoms don't improve, worsen or new problems develop. The patient verbalized understanding. The patient was told to call to get lab results if they haven't heard anything in the next week.   This note has been created with Education officer, environmental. Any transcriptional errors are unintentional.   [name], NP 01/28/2023, 5:16 PM

## 2023-01-30 ENCOUNTER — Other Ambulatory Visit (HOSPITAL_COMMUNITY): Payer: Self-pay

## 2023-02-01 ENCOUNTER — Other Ambulatory Visit (HOSPITAL_COMMUNITY): Payer: Self-pay

## 2023-02-01 ENCOUNTER — Encounter: Payer: Self-pay | Admitting: Pharmacist

## 2023-02-01 ENCOUNTER — Other Ambulatory Visit: Payer: Self-pay

## 2023-02-04 ENCOUNTER — Other Ambulatory Visit: Payer: Self-pay

## 2023-02-05 ENCOUNTER — Other Ambulatory Visit: Payer: Self-pay

## 2023-02-13 ENCOUNTER — Other Ambulatory Visit: Payer: Self-pay

## 2023-02-15 ENCOUNTER — Other Ambulatory Visit: Payer: Self-pay

## 2023-02-15 ENCOUNTER — Encounter: Payer: Self-pay | Admitting: Pharmacist

## 2023-02-15 ENCOUNTER — Other Ambulatory Visit (INDEPENDENT_AMBULATORY_CARE_PROVIDER_SITE_OTHER): Payer: Self-pay | Admitting: Primary Care

## 2023-02-15 ENCOUNTER — Other Ambulatory Visit (HOSPITAL_COMMUNITY): Payer: Self-pay

## 2023-02-15 NOTE — Progress Notes (Signed)
Patient attempted to be outreached by Sharman Cheek, PharmD Candidate on 01/25/23 to discuss hypertension. Left voicemail for patient to return our call at their convenience at [phone].  [facility], [place]

## 2023-02-16 ENCOUNTER — Encounter: Payer: Self-pay | Admitting: Pharmacist

## 2023-02-16 ENCOUNTER — Other Ambulatory Visit: Payer: Self-pay

## 2023-02-16 ENCOUNTER — Other Ambulatory Visit (HOSPITAL_COMMUNITY): Payer: Self-pay

## 2023-02-16 MED ORDER — GLIPIZIDE 5 MG PO TABS
5.0000 mg | ORAL_TABLET | Freq: Every day | ORAL | 1 refills | Status: DC
  Filled 2023-02-16: qty 30, 30d supply, fill #0

## 2023-02-18 ENCOUNTER — Other Ambulatory Visit: Payer: Self-pay

## 2023-02-19 ENCOUNTER — Other Ambulatory Visit: Payer: Self-pay

## 2023-02-19 ENCOUNTER — Other Ambulatory Visit (HOSPITAL_COMMUNITY): Payer: Self-pay

## 2023-02-22 ENCOUNTER — Other Ambulatory Visit (HOSPITAL_COMMUNITY): Payer: Self-pay

## 2023-02-22 ENCOUNTER — Other Ambulatory Visit: Payer: Self-pay

## 2023-02-23 ENCOUNTER — Encounter: Payer: Self-pay | Admitting: Pharmacist

## 2023-02-23 ENCOUNTER — Other Ambulatory Visit (HOSPITAL_COMMUNITY): Payer: Self-pay

## 2023-02-23 ENCOUNTER — Ambulatory Visit: Payer: PRIVATE HEALTH INSURANCE | Attending: Primary Care | Admitting: Pharmacist

## 2023-02-23 ENCOUNTER — Other Ambulatory Visit: Payer: Self-pay

## 2023-02-23 VITALS — BP 128/83

## 2023-02-23 DIAGNOSIS — Z794 Long term (current) use of insulin: Secondary | ICD-10-CM

## 2023-02-23 DIAGNOSIS — E1139 Type 2 diabetes mellitus with other diabetic ophthalmic complication: Secondary | ICD-10-CM

## 2023-02-23 MED ORDER — SEMAGLUTIDE (1 MG/DOSE) 4 MG/3ML ~~LOC~~ SOPN
1.0000 mg | PEN_INJECTOR | SUBCUTANEOUS | 0 refills | Status: DC
  Filled 2023-02-23 – 2023-03-29 (×2): qty 3, 28d supply, fill #0
  Filled 2023-05-01: qty 3, 28d supply, fill #1

## 2023-02-23 MED ORDER — TRESIBA FLEXTOUCH 100 UNIT/ML ~~LOC~~ SOPN
30.0000 [IU] | PEN_INJECTOR | Freq: Every day | SUBCUTANEOUS | 1 refills | Status: DC
  Filled 2023-02-23 – 2023-03-12 (×2): qty 9, 30d supply, fill #0
  Filled 2023-03-22 – 2023-04-06 (×3): qty 9, 30d supply, fill #1
  Filled 2023-05-01 – 2023-05-03 (×2): qty 9, 30d supply, fill #2

## 2023-02-23 NOTE — Progress Notes (Signed)
    S:     No chief complaint on file.  62 y.o. male who presents for diabetes evaluation, education, and management.  PMH is significant for T2DM, HTN, CAD s/p NSTEMI, HLD, post surgical retinal detachment.   Patient was referred and last seen by Primary Care Provider, Gwinda Passe, on 01/26/23, where A1c was 8.7 and home blood sugars 280 > 300. Last seen by pharmacy clinic on 01/21/2023. At this visit metformin XR 500 mg daily was discontinued due to patient unable to tolerate it. Trulicity 4.5 mg discontinued and started Ozempic 0.5 mg weekly, which he reported not taking.  Today, patient arrives in good spirits and presents without any assistance. Patient reports diabetes is longstanding. Reports he is not taking the glipizide but is adherent to all other medications as prescribed. Patient denies hypoglycemic events. Reports nocturia, neuropathy (left foot), blurry vision, and self foot exams. Informs me he has not been wearing his Libre sensor on for a week and not checking blood sugars. Will put it on today.  Family/Social History:  Fhx: stroke, cancer Tobacco: current smoker  Current diabetes medications include: Ozempic 0.5 mg weekly, Tresiba 25 units daily, glipizide 5 mg daily Current hypertension medications include: amlodipine 10 mg daily, carvedilol 25 mg BID Current hyperlipidemia medications include: atorvastatin 80 mg daily  Insurance coverage: BCBS.   O:   ROS  Physical Exam  Lab Results  Component Value Date   HGBA1C 8.7 (A) 01/26/2023   Vitals:   02/23/23 1623  BP: 128/83    Lipid Panel     Component Value Date/Time   CHOL 184 10/26/2022 0951   TRIG 111 10/26/2022 0951   HDL 42 10/26/2022 0951   CHOLHDL 4.4 10/26/2022 0951   CHOLHDL 6.6 07/12/2014 0330   VLDL 28 07/12/2014 0330   LDLCALC 122 (H) 10/26/2022 0951    Clinical Atherosclerotic Cardiovascular Disease (ASCVD): Yes  The ASCVD Risk score (Arnett DK, et al., 2019) failed to calculate for  the following reasons:   The patient has a prior MI or stroke diagnosis   Patient is participating in a Managed Medicaid Plan: No  A/P: Diabetes longstanding currently close to goal. Patient is able to verbalize appropriate hypoglycemia management plan. Control is suboptimal due to poor medication adherence and not checking his sugars. -Discontinued Glipizide 5 mg daily. -Increased dose of Tresiba (insulin degludec) from 25 units to 30 units daily -Increased dose of Ozempic (semaglutide) to 1 mg weekly. Refilled ozempic 0.5 mg yesterday so he will inject two 0.5 mg injections weekly.  -Patient educated on purpose, proper use, and potential adverse effects.  -Extensively discussed pathophysiology of diabetes, recommended lifestyle interventions, dietary effects on blood sugar control.  -Counseled on s/sx of and management of hypoglycemia.  -Next A1c anticipated August 2024.   ASCVD risk - secondary prevention in patient with diabetes. Last LDL is 122, not at goal of <70 mg/dL. high intensity statin indicated.  -Continued atorvastatin 80 mg.   Hypertension longstanding currently controlled. Blood pressure goal of <130/80, today in clinic 128/83 mmHg. Medication adherence optimal. -Continued amlodipine 10 mg daily, carvedilol 25 mg BID. Written patient instructions provided. Patient verbalized understanding of treatment plan.   Total time in face to face counseling 30 minutes.    Follow-up:  Pharmacist in 1 month.  Patient seen with  Alesia Banda, PharmD Candidate [employer]  Butch Penny, PharmD, [place], CPP Clinical Pharmacist [facility] [phone]

## 2023-03-12 ENCOUNTER — Telehealth: Payer: Self-pay | Admitting: Cardiology

## 2023-03-12 ENCOUNTER — Other Ambulatory Visit: Payer: Self-pay

## 2023-03-12 ENCOUNTER — Other Ambulatory Visit (HOSPITAL_COMMUNITY): Payer: Self-pay

## 2023-03-12 DIAGNOSIS — I10 Essential (primary) hypertension: Secondary | ICD-10-CM

## 2023-03-12 NOTE — Telephone Encounter (Signed)
Spoke to the pharmacist  from [facility], pt recently transferred medications from [facility].  Pt is currently taken Carvedilol 25 mg daily, however, the pharmacist would like clarification if the pt should take Carvedilol 25 mg BID.   Office note from OV on 2/22: Medical therapy.  Apparently patient has only been taking carvedilol 12.5 mg at times daily and at times not taking at all in addition to his amlodipine.  Recommend resumption of 12.5 twice daily with possible further titration to 25 mg twice a day.  Smoking cessation is essential.  Aggressive lipid-lowering therapy with target LDL less than 55 and his diabetic male with CAD.    Will forward to APP for advise

## 2023-03-12 NOTE — Telephone Encounter (Signed)
Pt c/o medication issue:  1. Name of Medication:   carvedilol (COREG) 25 MG tablet    2. How are you currently taking this medication (dosage and times per day)? Take 1 tablet (25 mg total) by mouth daily.   3. Are you having a reaction (difficulty breathing--STAT)? No  4. What is your medication issue? Pharmacy calling to ask if the medication listed above should be twice a day and would like a callback regarding this matter.

## 2023-03-13 ENCOUNTER — Other Ambulatory Visit (HOSPITAL_COMMUNITY): Payer: Self-pay

## 2023-03-15 ENCOUNTER — Other Ambulatory Visit (HOSPITAL_COMMUNITY): Payer: Self-pay

## 2023-03-15 MED ORDER — CARVEDILOL 25 MG PO TABS
25.0000 mg | ORAL_TABLET | Freq: Two times a day (BID) | ORAL | 11 refills | Status: DC
  Filled 2023-03-15: qty 60, fill #0
  Filled 2023-03-22 – 2023-04-06 (×3): qty 60, 30d supply, fill #0
  Filled 2023-05-01: qty 60, 30d supply, fill #1
  Filled 2023-05-20 – 2023-06-07 (×2): qty 60, 30d supply, fill #2
  Filled 2023-06-14 – 2023-07-04 (×4): qty 60, 30d supply, fill #3
  Filled 2023-07-10 – 2023-09-25 (×7): qty 60, 30d supply, fill #4

## 2023-03-15 MED ORDER — CARVEDILOL 25 MG PO TABS
ORAL_TABLET | ORAL | 11 refills | Status: DC
  Filled 2023-03-15: qty 60, fill #0

## 2023-03-15 NOTE — Telephone Encounter (Signed)
Spoke with the pt, he states that she is taking carvedilol 25mg  once daily. Pt states his blood pressure is under good control but does not have any numbers to provide. Upon chart review prescription looks like it was sent in incorrectly. Prescription states take 25mg  once daily but quantity was 180. Pt will continue to take 25mg  once daily and will monitor his blood pressure in the evening. Pt will take another dose if needed. Spoke with [facility] to clarify. They did suggest that replacement prescription be sent that states pt may take carvedilol 25mg  once or twice a day as directed so that insurance will allow them to provide pt with more tablets. Prescription sent.

## 2023-03-15 NOTE — Telephone Encounter (Signed)
Pharmacy clarification for carvedilol. Advised he is to take 12.5 mg twice a day.  Call to patient who is immediatly hostile and states "I aint crackin no pills". He states he has 25 mg and he is taking that and again repeats "I ain't crackin no pills Please advise as was to send updated RX to pharmacy.  This medication should be taken twice a day.  Thank you!  DW

## 2023-03-16 ENCOUNTER — Other Ambulatory Visit (HOSPITAL_COMMUNITY): Payer: Self-pay

## 2023-03-17 ENCOUNTER — Other Ambulatory Visit (HOSPITAL_COMMUNITY): Payer: Self-pay

## 2023-03-22 ENCOUNTER — Other Ambulatory Visit: Payer: Self-pay | Admitting: Physician Assistant

## 2023-03-22 ENCOUNTER — Other Ambulatory Visit: Payer: Self-pay

## 2023-03-22 ENCOUNTER — Other Ambulatory Visit (HOSPITAL_COMMUNITY): Payer: Self-pay

## 2023-03-23 ENCOUNTER — Other Ambulatory Visit (HOSPITAL_COMMUNITY): Payer: Self-pay

## 2023-03-23 ENCOUNTER — Other Ambulatory Visit: Payer: Self-pay

## 2023-03-23 ENCOUNTER — Encounter: Payer: Self-pay | Admitting: Pharmacist

## 2023-03-23 MED ORDER — OFLOXACIN 0.3 % OP SOLN
OPHTHALMIC | 1 refills | Status: DC
  Filled 2023-03-23: qty 5, 7d supply, fill #0
  Filled 2023-03-29: qty 5, 25d supply, fill #0
  Filled 2023-04-06: qty 5, 7d supply, fill #1
  Filled 2023-04-24 – 2023-05-01 (×2): qty 5, 25d supply, fill #1

## 2023-03-24 ENCOUNTER — Encounter (INDEPENDENT_AMBULATORY_CARE_PROVIDER_SITE_OTHER): Payer: Self-pay | Admitting: Primary Care

## 2023-03-24 ENCOUNTER — Ambulatory Visit (INDEPENDENT_AMBULATORY_CARE_PROVIDER_SITE_OTHER): Payer: PRIVATE HEALTH INSURANCE | Admitting: Primary Care

## 2023-03-24 VITALS — BP 122/87 | HR 93 | Resp 16 | Wt 219.6 lb

## 2023-03-24 DIAGNOSIS — Z794 Long term (current) use of insulin: Secondary | ICD-10-CM

## 2023-03-24 DIAGNOSIS — E1139 Type 2 diabetes mellitus with other diabetic ophthalmic complication: Secondary | ICD-10-CM | POA: Diagnosis not present

## 2023-03-24 DIAGNOSIS — I159 Secondary hypertension, unspecified: Secondary | ICD-10-CM | POA: Diagnosis not present

## 2023-03-24 NOTE — Progress Notes (Signed)
[facility]  Thomas Watts, is a 62 y.o. male  VQQ:[id number]  [id number]  DOB - 04-20-1961  Chief Complaint  Patient presents with   Diabetes   Hypertension       Subjective:   Thomas Watts is a 62 y.o. male here today for a follow up visit management of HTN . Patient has No headache, No chest pain, No abdominal pain - No Nausea, No new weakness tingling or numbness, No Cough - shortness of breath  No problems updated.  Allergies  Allergen Reactions   Ace Inhibitors Swelling   Penicillin G Benzathine & Proc Rash   Penicillins Hives    Hives     Past Medical History:  Diagnosis Date   CAD (coronary artery disease)    a. NSTEMI and repetitive NSVT s/p overlapping DES x2 to [facility] on 07/11/14 and staged PCI w/ DES to LCx on 07/13/14   Diabetes mellitus type II, controlled (HCC)    Erectile dysfunction    Hyperlipemia    Hypertension    Marijuana abuse    Noncompliance    NSVT (nonsustained ventricular tachycardia) (HCC)    a. in the setting of ACS/NSTEMI 07/11/14;  b. 06/2014 Echo: EF 50-55%.   Obesity    Peyronie disease    Prostate CA (HCC)    a. 11/2013 s/p prostatectomy.   Tobacco abuse     Current Outpatient Medications on File Prior to Visit  Medication Sig Dispense Refill   amLODipine (NORVASC) 10 MG tablet Take 1 tablet (10 mg total) by mouth daily. 90 tablet 1   aspirin EC 81 MG tablet Take 1 tablet (81 mg total) by mouth daily. 90 tablet 2   atorvastatin (LIPITOR) 80 MG tablet Take 1 tablet (80 mg total) by mouth daily at 6 PM. 90 tablet 1   carvedilol (COREG) 25 MG tablet Take 1 tablet (25 mg total) by mouth 2 (two) times daily as directed. 60 tablet 11   Continuous Blood Gluc Sensor (FREESTYLE LIBRE 3 SENSOR) MISC 1 Units by Does not apply route daily. Place 1 sensor on the skin every 14 days. Use to check glucose continuously 2 each 1   Continuous Glucose Sensor (FREESTYLE LIBRE 3 SENSOR) MISC Place 1 sensor on the skin every  14 days. Use to check glucose continuously. 2 each 1   hydrocortisone cream 1 % Apply to affected area 2 times daily 14 g 0   insulin degludec (TRESIBA FLEXTOUCH) 100 UNIT/ML FlexTouch Pen Inject 30 Units into the skin daily. 15 mL 1   Insulin Pen Needle (TRUEPLUS 5-BEVEL PEN NEEDLES) 32G X 4 MM MISC Inject 1 each into the skin daily. 100 each 11   nitroGLYCERIN (NITROSTAT) 0.4 MG SL tablet Place 1 tablet (0.4 mg total) under the tongue every 5 (five) minutes x 3 doses as needed for chest pain. 25 tablet 3   ofloxacin (OCUFLOX) 0.3 % ophthalmic solution Place 1 drop into the right eye 4 times daily FOR 7 DAYS STARTING AFTER SURGERY 5 mL 1   Semaglutide, 1 MG/DOSE, 4 MG/3ML SOPN Inject 1 mg as directed once a week. 9 mL 0   sildenafil (REVATIO) 20 MG tablet Take 2-5 tablets (40-100 mg total) by mouth 1-2 hrs prior to sexual activity on an empty stomach. 30 tablet 3   No current facility-administered medications on file prior to visit.    Objective:   Vitals:   03/24/23 1514  BP: 122/87  Pulse: 93  Resp: 16  SpO2: 96%  Weight: 219 lb 9.6 oz (99.6 kg)    Comprehensive ROS Pertinent positive and negative noted in HPI   Exam General appearance : Awake, alert, not in any distress. Speech Clear. Not toxic looking HEENT: Atraumatic and Normocephalic, pupils equally reactive to light and accomodation Neck: Supple, no JVD. No cervical lymphadenopathy.  Chest: Good air entry bilaterally, no added sounds  CVS: S1 S2 regular, no murmurs.  Abdomen: Bowel sounds present, Non tender and not distended with no gaurding, rigidity or rebound. Extremities: B/L Lower Ext shows no edema, both legs are warm to touch Neurology: Awake alert, and oriented X 3, CN II-XII intact, Non focal Skin: No Rash  Data Review Lab Results  Component Value Date   HGBA1C 8.7 (A) 01/26/2023   HGBA1C 8.6 12/28/2022   HGBA1C 8.5 (A) 10/22/2022    Assessment & Plan  Thomas Watts was seen today for diabetes and  hypertension.  Diagnoses and all orders for this visit:   Type 2 diabetes mellitus with other ophthalmic complication, with long-term current use of insulin (HCC) 2/2 Secondary hypertension HTN managed by cardiology and T2D to early for A1C- Seen Thomas Watts 02/23/23 medication adjusted too soon for A1C - educated on lifestyle modifications, including but not limited to diet choices and adding exercise to daily routine.     Patient have been counseled extensively about nutrition and exercise. Other issues discussed during this visit include: low cholesterol diet, weight control and daily exercise, foot care, annual eye examinations at Ophthalmology, importance of adherence with medications and regular follow-up. We also discussed long term complications of uncontrolled diabetes and hypertension.   Return in about 6 weeks (around 05/05/2023) for DM.  The patient was given clear instructions to go to ER or return to medical center if symptoms don't improve, worsen or new problems develop. The patient verbalized understanding. The patient was told to call to get lab results if they haven't heard anything in the next week.   This note has been created with Education officer, environmental. Any transcriptional errors are unintentional.   [name], NP 03/29/2023, 3:58 PM

## 2023-03-25 ENCOUNTER — Other Ambulatory Visit: Payer: Self-pay

## 2023-03-26 ENCOUNTER — Other Ambulatory Visit: Payer: Self-pay

## 2023-03-29 ENCOUNTER — Other Ambulatory Visit: Payer: Self-pay

## 2023-03-29 ENCOUNTER — Other Ambulatory Visit (HOSPITAL_COMMUNITY): Payer: Self-pay

## 2023-03-30 ENCOUNTER — Encounter: Payer: Self-pay | Admitting: Pharmacist

## 2023-03-30 ENCOUNTER — Other Ambulatory Visit (HOSPITAL_COMMUNITY): Payer: Self-pay

## 2023-03-30 ENCOUNTER — Other Ambulatory Visit: Payer: Self-pay

## 2023-03-31 ENCOUNTER — Other Ambulatory Visit (HOSPITAL_COMMUNITY): Payer: Self-pay

## 2023-04-01 ENCOUNTER — Other Ambulatory Visit: Payer: Self-pay

## 2023-04-01 MED ORDER — FREESTYLE LIBRE 3 SENSOR MISC
1 refills | Status: DC
  Filled 2023-04-01: qty 2, 28d supply, fill #0
  Filled 2023-04-24 – 2023-05-01 (×2): qty 2, 28d supply, fill #1

## 2023-04-01 NOTE — Progress Notes (Signed)
No SDOH changes since 7/8

## 2023-04-05 ENCOUNTER — Other Ambulatory Visit: Payer: Self-pay

## 2023-04-06 ENCOUNTER — Other Ambulatory Visit (HOSPITAL_COMMUNITY): Payer: Self-pay

## 2023-04-06 ENCOUNTER — Other Ambulatory Visit: Payer: Self-pay

## 2023-04-07 ENCOUNTER — Other Ambulatory Visit (HOSPITAL_COMMUNITY): Payer: Self-pay

## 2023-04-07 ENCOUNTER — Other Ambulatory Visit: Payer: Self-pay

## 2023-04-08 ENCOUNTER — Other Ambulatory Visit: Payer: Self-pay

## 2023-04-08 ENCOUNTER — Other Ambulatory Visit (HOSPITAL_COMMUNITY): Payer: Self-pay

## 2023-04-19 ENCOUNTER — Encounter: Payer: Self-pay | Admitting: *Deleted

## 2023-04-19 NOTE — Progress Notes (Signed)
Pt attended 04/01/23 screening event where his b/p was 130/80- earlier appt on 03/22/23, pt had identified food and transportation insecurities. Pt had also been seen at previous screening event, so this follow up is actually the 60 day f/u on this same pt since he came to the catch 5 in 5 event also on 12/28/22  this year. During f/u phone call with him today, the pt said he has lost his job but does still have Medicaid and has been able to keep getting and taking his meds, and he notes he is aware he can get transportation thru his Medicaid. He just saw his PCP Gwinda Passe, NP on 03/24/23 and has had ongoing Pharmacy support. He is currently living with family/friends and asked to have the food panty, housing resources, and [phone] info mailed to him to help him address additional SDOH needs. This health equity team member also let pt know she would forward his SDOH needs and jobless status to NP [facility] care manager, Robyne Peers for potential further follow up.

## 2023-04-24 ENCOUNTER — Other Ambulatory Visit (HOSPITAL_COMMUNITY): Payer: Self-pay

## 2023-04-26 ENCOUNTER — Other Ambulatory Visit: Payer: Self-pay

## 2023-04-26 ENCOUNTER — Encounter: Payer: Self-pay | Admitting: Pharmacist

## 2023-04-27 ENCOUNTER — Ambulatory Visit (INDEPENDENT_AMBULATORY_CARE_PROVIDER_SITE_OTHER): Payer: PRIVATE HEALTH INSURANCE | Admitting: Primary Care

## 2023-04-27 ENCOUNTER — Telehealth: Payer: Self-pay

## 2023-04-27 NOTE — Telephone Encounter (Signed)
At the request of Dorie Rank, RN/ Health Equity. I called the patient to confirm his appointment at [facility] next week and to inquire about his need for community resources- housing,food, employment as well as  referral to Managed Medicaid Care Management.   He said he is aware of the appointment next week and will call [employer] for transportation.  He then said he needed to go and would have to call me back

## 2023-04-29 ENCOUNTER — Other Ambulatory Visit: Payer: Self-pay

## 2023-05-01 ENCOUNTER — Other Ambulatory Visit: Payer: Self-pay

## 2023-05-01 ENCOUNTER — Other Ambulatory Visit (HOSPITAL_COMMUNITY): Payer: Self-pay

## 2023-05-01 ENCOUNTER — Other Ambulatory Visit (INDEPENDENT_AMBULATORY_CARE_PROVIDER_SITE_OTHER): Payer: Self-pay | Admitting: Primary Care

## 2023-05-01 DIAGNOSIS — E782 Mixed hyperlipidemia: Secondary | ICD-10-CM

## 2023-05-03 ENCOUNTER — Other Ambulatory Visit (HOSPITAL_COMMUNITY): Payer: Self-pay

## 2023-05-03 ENCOUNTER — Other Ambulatory Visit: Payer: Self-pay

## 2023-05-04 ENCOUNTER — Other Ambulatory Visit: Payer: Self-pay | Admitting: Family Medicine

## 2023-05-04 ENCOUNTER — Other Ambulatory Visit: Payer: Self-pay

## 2023-05-04 ENCOUNTER — Other Ambulatory Visit (HOSPITAL_COMMUNITY): Payer: Self-pay

## 2023-05-04 MED ORDER — TRESIBA FLEXTOUCH 100 UNIT/ML ~~LOC~~ SOPN
30.0000 [IU] | PEN_INJECTOR | Freq: Every day | SUBCUTANEOUS | 0 refills | Status: DC
  Filled 2023-05-04: qty 9, 30d supply, fill #0

## 2023-05-05 ENCOUNTER — Encounter (HOSPITAL_COMMUNITY): Payer: Self-pay

## 2023-05-05 ENCOUNTER — Other Ambulatory Visit (HOSPITAL_COMMUNITY): Payer: Self-pay

## 2023-05-06 ENCOUNTER — Ambulatory Visit (INDEPENDENT_AMBULATORY_CARE_PROVIDER_SITE_OTHER): Payer: PRIVATE HEALTH INSURANCE | Admitting: Primary Care

## 2023-05-06 ENCOUNTER — Other Ambulatory Visit: Payer: Self-pay | Admitting: Pharmacist

## 2023-05-06 ENCOUNTER — Encounter (INDEPENDENT_AMBULATORY_CARE_PROVIDER_SITE_OTHER): Payer: Self-pay | Admitting: Primary Care

## 2023-05-06 ENCOUNTER — Other Ambulatory Visit: Payer: Self-pay

## 2023-05-06 ENCOUNTER — Other Ambulatory Visit (HOSPITAL_COMMUNITY): Payer: Self-pay

## 2023-05-06 VITALS — BP 132/93 | HR 64 | Resp 16 | Wt 221.6 lb

## 2023-05-06 DIAGNOSIS — Z1159 Encounter for screening for other viral diseases: Secondary | ICD-10-CM | POA: Diagnosis not present

## 2023-05-06 DIAGNOSIS — E1139 Type 2 diabetes mellitus with other diabetic ophthalmic complication: Secondary | ICD-10-CM | POA: Diagnosis not present

## 2023-05-06 DIAGNOSIS — I159 Secondary hypertension, unspecified: Secondary | ICD-10-CM

## 2023-05-06 DIAGNOSIS — E782 Mixed hyperlipidemia: Secondary | ICD-10-CM

## 2023-05-06 DIAGNOSIS — H2701 Aphakia, right eye: Secondary | ICD-10-CM

## 2023-05-06 DIAGNOSIS — Z122 Encounter for screening for malignant neoplasm of respiratory organs: Secondary | ICD-10-CM

## 2023-05-06 DIAGNOSIS — Z114 Encounter for screening for human immunodeficiency virus [HIV]: Secondary | ICD-10-CM

## 2023-05-06 DIAGNOSIS — Z794 Long term (current) use of insulin: Secondary | ICD-10-CM

## 2023-05-06 LAB — POCT GLYCOSYLATED HEMOGLOBIN (HGB A1C): HbA1c, POC (controlled diabetic range): 10.2 % — AB (ref 0.0–7.0)

## 2023-05-06 MED ORDER — TRESIBA FLEXTOUCH 100 UNIT/ML ~~LOC~~ SOPN
36.0000 [IU] | PEN_INJECTOR | Freq: Every day | SUBCUTANEOUS | 0 refills | Status: DC
  Filled 2023-05-06 – 2023-05-28 (×4): qty 9, 25d supply, fill #0

## 2023-05-06 MED ORDER — SEMAGLUTIDE (2 MG/DOSE) 8 MG/3ML ~~LOC~~ SOPN
2.0000 mg | PEN_INJECTOR | SUBCUTANEOUS | 1 refills | Status: DC
  Filled 2023-05-06: qty 3, 28d supply, fill #0
  Filled 2023-05-20: qty 9, 84d supply, fill #0
  Filled 2023-05-24 – 2023-06-30 (×6): qty 9, 84d supply, fill #1
  Filled 2023-07-04 – 2023-07-22 (×2): qty 3, 28d supply, fill #1
  Filled 2023-07-23 – 2023-08-17 (×5): qty 3, 28d supply, fill #2
  Filled 2023-08-23 – 2023-09-25 (×4): qty 3, 28d supply, fill #3

## 2023-05-06 NOTE — Progress Notes (Signed)
[facility]  Thomas Watts, is a 62 y.o. male  [id number]  [id number]  DOB - 24-Oct-1960  No chief complaint on file.      Subjective:   Thomas Watts is a 62 y.o. male here today for a follow up visit. Denies polyuria, polydipsia, polyphasia or vision changes.  Does not check blood sugars at home.  He is concered with his right eye which previous had retina surgery back in march 2024. Woke up this morning right eye is swollen and conjunctiva and red. Also vision changes described as wavy and sensitive to sunlight.   No problems updated.  Allergies  Allergen Reactions   Ace Inhibitors Swelling   Penicillin G Benzathine & Proc Rash   Penicillins Hives    Hives     Past Medical History:  Diagnosis Date   CAD (coronary artery disease)    a. NSTEMI and repetitive NSVT s/p overlapping DES x2 to [facility] on 07/11/14 and staged PCI w/ DES to LCx on 07/13/14   Diabetes mellitus type II, controlled (HCC)    Erectile dysfunction    Hyperlipemia    Hypertension    Marijuana abuse    Noncompliance    NSVT (nonsustained ventricular tachycardia) (HCC)    a. in the setting of ACS/NSTEMI 07/11/14;  b. 06/2014 Echo: EF 50-55%.   Obesity    Peyronie disease    Prostate CA (HCC)    a. 11/2013 s/p prostatectomy.   Tobacco abuse     Current Outpatient Medications on File Prior to Visit  Medication Sig Dispense Refill   amLODipine (NORVASC) 10 MG tablet Take 1 tablet (10 mg total) by mouth daily. 90 tablet 1   aspirin EC 81 MG tablet Take 1 tablet (81 mg total) by mouth daily. 90 tablet 2   atorvastatin (LIPITOR) 80 MG tablet Take 1 tablet (80 mg total) by mouth daily at 6 PM. 90 tablet 1   carvedilol (COREG) 25 MG tablet Take 1 tablet (25 mg total) by mouth 2 (two) times daily as directed. 60 tablet 11   Continuous Glucose Sensor (FREESTYLE LIBRE 3 SENSOR) MISC Place 1 sensor on the skin every 14 days. Use to check glucose continuously. 2 each 1    hydrocortisone cream 1 % Apply to affected area 2 times daily 14 g 0   insulin degludec (TRESIBA FLEXTOUCH) 100 UNIT/ML FlexTouch Pen Inject 30 Units into the skin daily. 9 mL 0   Insulin Pen Needle (TRUEPLUS 5-BEVEL PEN NEEDLES) 32G X 4 MM MISC Inject 1 each into the skin daily. 100 each 11   nitroGLYCERIN (NITROSTAT) 0.4 MG SL tablet Place 1 tablet (0.4 mg total) under the tongue every 5 (five) minutes x 3 doses as needed for chest pain. 25 tablet 3   ofloxacin (OCUFLOX) 0.3 % ophthalmic solution Place 1 drop into the right eye 4 times daily FOR 7 DAYS STARTING AFTER SURGERY 5 mL 1   Semaglutide, 1 MG/DOSE, 4 MG/3ML SOPN Inject 1 mg as directed once a week. 9 mL 0   sildenafil (REVATIO) 20 MG tablet Take 2-5 tablets (40-100 mg total) by mouth 1-2 hrs prior to sexual activity on an empty stomach. 30 tablet 3   No current facility-administered medications on file prior to visit.    Objective:  Blood Pressure 132/84    Comprehensive ROS Pertinent positive and negative noted in HPI   Exam General appearance : Awake, alert, not in any distress. Speech Clear. Not toxic looking HEENT: Atraumatic and Normocephalic, pupils  equally reactive to light and accomodation Neck: Supple, no JVD. No cervical lymphadenopathy.  Chest: Good air entry bilaterally, no added sounds  CVS: S1 S2 regular, no murmurs.  Abdomen: Bowel sounds present, Non tender and not distended with no gaurding, rigidity or rebound. Extremities: B/L Lower Ext shows no edema, both legs are warm to touch Neurology: Awake alert, and oriented X 3, CN II-XII intact, Non focal Skin: No Rash  Data Review Lab Results  Component Value Date   HGBA1C 8.7 (A) 01/26/2023   HGBA1C 8.6 12/28/2022   HGBA1C 8.5 (A) 10/22/2022    Assessment & Plan  Diagnoses and all orders for this visit:  Type 2 diabetes mellitus with other ophthalmic complication, with long-term current use of insulin (HCC)  Complications from uncontrolled diabetes  -diabetic retinopathy leading to blindness, diabetic nephropathy leading to dialysis, decrease in circulation decrease in sores or wound healing which may lead to amputations and increase of heart attack and stroke. Medications adjusted f/u clinical pharmacy  -     POCT glycosylated hemoglobin (Hb A1C) -     CBC with Differential/Platelet  Encounter for screening for HIV -     HIV Antibody (routine testing w rflx)  Encounter for HCV screening test for low risk patient -     HCV Ab w Reflex to Quant PCR  Aphakia of right eye -     Ambulatory referral to Ophthalmology  Secondary hypertension BP goal - < 130/80 Explained that having normal blood pressure is the goal and medications are helping to get to goal and maintain normal blood pressure. DIET: Limit salt intake, read nutrition labels to check salt content, limit fried and high fatty foods  Avoid using multisymptom OTC cold preparations that generally contain sudafed which can rise BP. Consult with pharmacist on best cold relief products to use for persons with HTN EXERCISE Discussed incorporating exercise such as walking - 30 minutes most days of the week and can do in 10 minute intervals    -     CMP14+EGFR  Mixed hyperlipidemia  Healthy lifestyle diet of fruits vegetables fish nuts whole grains and low saturated fat . Foods high in cholesterol or liver, fatty meats,cheese, butter avocados, nuts and seeds, chocolate and fried foods. -     Lipid panel  Encounter for screening for lung cancer -     CT CHEST LUNG CA SCREEN LOW DOSE W/O CM; Future      Patient have been counseled extensively about nutrition and exercise. Other issues discussed during this visit include: low cholesterol diet, weight control and daily exercise, foot care, annual eye examinations at Ophthalmology, importance of adherence with medications and regular follow-up. We also discussed long term complications of uncontrolled diabetes and hypertension.   No  follow-ups on file.  The patient was given clear instructions to go to ER or return to medical center if symptoms don't improve, worsen or new problems develop. The patient verbalized understanding. The patient was told to call to get lab results if they haven't heard anything in the next week.   This note has been created with Education officer, environmental. Any transcriptional errors are unintentional.   [name], NP 05/06/2023, 2:46 PM

## 2023-05-06 NOTE — Patient Instructions (Signed)
Contact [facility]  [address] [place], [place] [address] PH# [phone] To schedule colonoscopy

## 2023-05-07 ENCOUNTER — Other Ambulatory Visit: Payer: Self-pay

## 2023-05-07 ENCOUNTER — Other Ambulatory Visit (HOSPITAL_COMMUNITY): Payer: Self-pay

## 2023-05-07 ENCOUNTER — Ambulatory Visit (INDEPENDENT_AMBULATORY_CARE_PROVIDER_SITE_OTHER): Payer: Self-pay

## 2023-05-07 ENCOUNTER — Encounter (HOSPITAL_COMMUNITY): Payer: Self-pay

## 2023-05-07 ENCOUNTER — Emergency Department (HOSPITAL_COMMUNITY)
Admission: EM | Admit: 2023-05-07 | Discharge: 2023-05-07 | Disposition: A | Discharge status: Home / Self Care | Payer: PRIVATE HEALTH INSURANCE | Attending: Emergency Medicine | Admitting: Emergency Medicine

## 2023-05-07 DIAGNOSIS — H5789 Other specified disorders of eye and adnexa: Secondary | ICD-10-CM | POA: Insufficient documentation

## 2023-05-07 DIAGNOSIS — I2510 Atherosclerotic heart disease of native coronary artery without angina pectoris: Secondary | ICD-10-CM | POA: Diagnosis not present

## 2023-05-07 DIAGNOSIS — E119 Type 2 diabetes mellitus without complications: Secondary | ICD-10-CM | POA: Insufficient documentation

## 2023-05-07 DIAGNOSIS — I10 Essential (primary) hypertension: Secondary | ICD-10-CM | POA: Diagnosis not present

## 2023-05-07 LAB — CMP14+EGFR
ALT: 13 IU/L (ref 0–44)
AST: 14 IU/L (ref 0–40)
Albumin: 4.4 g/dL (ref 3.9–4.9)
Alkaline Phosphatase: 98 IU/L (ref 44–121)
BUN/Creatinine Ratio: 9 — ABNORMAL LOW (ref 10–24)
BUN: 8 mg/dL (ref 8–27)
Bilirubin Total: 0.6 mg/dL (ref 0.0–1.2)
CO2: 25 mmol/L (ref 20–29)
Calcium: 10 mg/dL (ref 8.6–10.2)
Chloride: 99 mmol/L (ref 96–106)
Creatinine, Ser: 0.86 mg/dL (ref 0.76–1.27)
Globulin, Total: 3.6 g/dL (ref 1.5–4.5)
Glucose: 119 mg/dL — ABNORMAL HIGH (ref 70–99)
Potassium: 3.9 mmol/L (ref 3.5–5.2)
Sodium: 137 mmol/L (ref 134–144)
Total Protein: 8 g/dL (ref 6.0–8.5)
eGFR: 99 mL/min/{1.73_m2} (ref >59)

## 2023-05-07 LAB — HCV AB W REFLEX TO QUANT PCR: HCV Ab: NONREACTIVE

## 2023-05-07 LAB — CBC WITH DIFFERENTIAL/PLATELET
Basophils Absolute: 0 10*3/uL (ref 0.0–0.2)
Basos: 1 %
EOS (ABSOLUTE): 0.1 10*3/uL (ref 0.0–0.4)
Eos: 3 %
Hematocrit: 43.4 % (ref 37.5–51.0)
Hemoglobin: 14.8 g/dL (ref 13.0–17.7)
Immature Grans (Abs): 0 10*3/uL (ref 0.0–0.1)
Immature Granulocytes: 0 %
Lymphocytes Absolute: 2.2 10*3/uL (ref 0.7–3.1)
Lymphs: 43 %
MCH: 29.1 pg (ref 26.6–33.0)
MCHC: 34.1 g/dL (ref 31.5–35.7)
MCV: 85 fL (ref 79–97)
Monocytes Absolute: 0.3 10*3/uL (ref 0.1–0.9)
Monocytes: 5 %
Neutrophils Absolute: 2.5 10*3/uL (ref 1.4–7.0)
Neutrophils: 48 %
Platelets: 281 10*3/uL (ref 150–450)
RBC: 5.08 x10E6/uL (ref 4.14–5.80)
RDW: 15.1 % (ref 11.6–15.4)
WBC: 5.1 10*3/uL (ref 3.4–10.8)

## 2023-05-07 LAB — HCV INTERPRETATION

## 2023-05-07 LAB — LIPID PANEL
Chol/HDL Ratio: 4.6 ratio (ref 0.0–5.0)
Cholesterol, Total: 194 mg/dL (ref 100–199)
HDL: 42 mg/dL (ref >39)
LDL Chol Calc (NIH): 133 mg/dL — ABNORMAL HIGH (ref 0–99)
Triglycerides: 107 mg/dL (ref 0–149)
VLDL Cholesterol Cal: 19 mg/dL (ref 5–40)

## 2023-05-07 LAB — CBG MONITORING, ED: Glucose-Capillary: 158 mg/dL — ABNORMAL HIGH (ref 70–99)

## 2023-05-07 LAB — HIV ANTIBODY (ROUTINE TESTING W REFLEX): HIV Screen 4th Generation wRfx: NONREACTIVE

## 2023-05-07 MED ORDER — FLUORESCEIN SODIUM 1 MG OP STRP
1.0000 | ORAL_STRIP | Freq: Once | OPHTHALMIC | Status: AC
  Administered 2023-05-07: 1 via OPHTHALMIC
  Filled 2023-05-07: qty 1

## 2023-05-07 MED ORDER — ERYTHROMYCIN 5 MG/GM OP OINT: TOPICAL_OINTMENT | OPHTHALMIC | 0 refills | Status: AC

## 2023-05-07 MED ORDER — TETRACAINE HCL 0.5 % OP SOLN
1.0000 [drp] | Freq: Once | OPHTHALMIC | Status: AC
  Administered 2023-05-07: 1 [drp] via OPHTHALMIC
  Filled 2023-05-07: qty 4

## 2023-05-07 NOTE — Telephone Encounter (Signed)
    Chief Complaint: Pt. States seen in office yesterday and "my eye is worse today, swollen, tearing. I have to get checked out today. Can't wait . I'm going to UC/ED." Offered to notify PCP, declines. Symptoms: Above Frequency: For awhile Pertinent Negatives: Patient denies vision changes Disposition: [] ED /[x] Urgent Care (no appt availability in office) / [] Appointment(In office/virtual)/ []  [facility]/ [] Home Care/ [] Refused Recommended Disposition /[] [facility]/ []  Follow-up with PCP Additional Notes:   Reason for Disposition  Looking at light causes MODERATE to SEVERE eye pain (i.e., photophobia)  Answer Assessment - Initial Assessment Questions 1. ONSET: "When did the pain start?" (e.g., minutes, hours, days)     March 2. TIMING: "Does the pain come and go, or has it been constant since it started?" (e.g., constant, intermittent, fleeting)     Constant 3. SEVERITY: "How bad is the pain?"   (Scale 1-10; mild, moderate or severe)   - MILD (1-3): doesn't interfere with normal activities    - MODERATE (4-7): interferes with normal activities or awakens from sleep    - SEVERE (8-10): excruciating pain and patient unable to do normal activities     Moderate 4. LOCATION: "Where does it hurt?"  (e.g., eyelid, eye, cheekbone)     Right  5. CAUSE: "What do you think is causing the pain?"     Had surgery 6. VISION: "Do you have blurred vision or changes in your vision?"      No 7. EYE DISCHARGE: "Is there any discharge (pus) from the eye(s)?"  If Yes, ask: "What color is it?"      Tearing 8. FEVER: "Do you have a fever?" If Yes, ask: "What is it, how was it measured, and when did it start?"      No 9. OTHER SYMPTOMS: "Do you have any other symptoms?" (e.g., headache, nasal discharge, facial rash)     No 10. PREGNANCY: "Is there any chance you are pregnant?" "When was your last menstrual period?"       N/a  Protocols used: Eye Pain and Other Symptoms-A-AH
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
## 2023-05-07 NOTE — Discharge Instructions (Signed)
You were seen today for eye pain, irritation, redness, and swelling. While you were here we monitored your vitals, preformed a physical exam, and checked your eye pressure and evaluated your eye for any lacerations or abrasions. These were all reassuring and there is no indication for any further testing or intervention in the emergency department at this time.   Things to do:  - Follow up with your primary care provider within the next 1-2 weeks -Please be sure to establish follow-up with an ophthalmologist - Please be sure to pick up your prescription for erythromycin eye ointment at [facility] on [facility]  Return to the emergency department if you have any new or worsening symptoms including vision changes, worsening pain, fevers, or if you have any other concerns.

## 2023-05-07 NOTE — Telephone Encounter (Signed)
Noted  

## 2023-05-07 NOTE — ED Triage Notes (Signed)
Pt c/o blurred vision in right eye started upon waking at 0800 yesterday. Pt's LKW 05/05/2023 at 2000. Pt states the right eye also hurts and has photosensitivity. Pt's right eye is erythematous. Pt states had right eye reattached 12/10/22 at [facility]. Pt's eye is watery

## 2023-05-20 ENCOUNTER — Other Ambulatory Visit (INDEPENDENT_AMBULATORY_CARE_PROVIDER_SITE_OTHER): Payer: Self-pay | Admitting: Primary Care

## 2023-05-20 ENCOUNTER — Other Ambulatory Visit (HOSPITAL_COMMUNITY): Payer: Self-pay

## 2023-05-20 ENCOUNTER — Other Ambulatory Visit: Payer: Self-pay | Admitting: Primary Care

## 2023-05-20 ENCOUNTER — Other Ambulatory Visit: Payer: Self-pay

## 2023-05-20 DIAGNOSIS — E782 Mixed hyperlipidemia: Secondary | ICD-10-CM

## 2023-05-20 DIAGNOSIS — I10 Essential (primary) hypertension: Secondary | ICD-10-CM

## 2023-05-20 MED ORDER — OFLOXACIN 0.3 % OP SOLN
1.0000 [drp] | Freq: Four times a day (QID) | OPHTHALMIC | 1 refills | Status: DC
  Filled 2023-05-20: qty 5, 25d supply, fill #0
  Filled 2023-05-24 – 2023-06-14 (×3): qty 5, 25d supply, fill #1

## 2023-05-21 ENCOUNTER — Other Ambulatory Visit: Payer: Self-pay

## 2023-05-21 ENCOUNTER — Encounter: Payer: Self-pay | Admitting: Pharmacist

## 2023-05-21 ENCOUNTER — Other Ambulatory Visit (HOSPITAL_COMMUNITY): Payer: Self-pay

## 2023-05-21 MED ORDER — ATORVASTATIN CALCIUM 80 MG PO TABS
80.0000 mg | ORAL_TABLET | Freq: Every day | ORAL | 1 refills | Status: DC
  Filled 2023-05-21 – 2023-06-07 (×2): qty 90, 90d supply, fill #0
  Filled 2023-06-24: qty 30, 30d supply, fill #0
  Filled 2023-07-26: qty 30, 30d supply, fill #1
  Filled 2023-09-07 – 2023-09-25 (×3): qty 30, 30d supply, fill #2

## 2023-05-21 MED ORDER — AMLODIPINE BESYLATE 10 MG PO TABS
10.0000 mg | ORAL_TABLET | Freq: Every day | ORAL | 1 refills | Status: DC
  Filled 2023-05-21 – 2023-06-14 (×3): qty 90, 90d supply, fill #0
  Filled 2023-06-21 – 2023-06-30 (×2): qty 30, 30d supply, fill #0
  Filled 2023-07-26: qty 30, 30d supply, fill #1
  Filled 2023-08-03 – 2023-09-25 (×5): qty 30, 30d supply, fill #2

## 2023-05-21 MED ORDER — FREESTYLE LIBRE 3 SENSOR MISC
1 refills | Status: DC
  Filled 2023-05-21: qty 2, fill #0
  Filled 2023-06-07 – 2023-06-08 (×2): qty 2, 28d supply, fill #0

## 2023-05-24 ENCOUNTER — Encounter (INDEPENDENT_AMBULATORY_CARE_PROVIDER_SITE_OTHER): Payer: Self-pay | Admitting: Primary Care

## 2023-05-25 ENCOUNTER — Other Ambulatory Visit (HOSPITAL_COMMUNITY): Payer: Self-pay

## 2023-05-27 ENCOUNTER — Other Ambulatory Visit: Payer: Self-pay

## 2023-05-28 ENCOUNTER — Other Ambulatory Visit (HOSPITAL_COMMUNITY): Payer: Self-pay

## 2023-05-28 ENCOUNTER — Ambulatory Visit: Payer: PRIVATE HEALTH INSURANCE | Attending: Cardiology | Admitting: Cardiology

## 2023-05-28 ENCOUNTER — Encounter (HOSPITAL_COMMUNITY): Payer: Self-pay | Admitting: Pharmacist

## 2023-05-28 ENCOUNTER — Encounter: Payer: Self-pay | Admitting: Cardiology

## 2023-05-28 VITALS — BP 132/80 | HR 91 | Ht 71.0 in | Wt 218.4 lb

## 2023-05-28 DIAGNOSIS — E1100 Type 2 diabetes mellitus with hyperosmolarity without nonketotic hyperglycemic-hyperosmolar coma (NKHHC): Secondary | ICD-10-CM

## 2023-05-28 DIAGNOSIS — I10 Essential (primary) hypertension: Secondary | ICD-10-CM | POA: Diagnosis not present

## 2023-05-28 DIAGNOSIS — I4729 Other ventricular tachycardia: Secondary | ICD-10-CM | POA: Diagnosis not present

## 2023-05-28 DIAGNOSIS — I2510 Atherosclerotic heart disease of native coronary artery without angina pectoris: Secondary | ICD-10-CM | POA: Diagnosis not present

## 2023-05-28 DIAGNOSIS — Z794 Long term (current) use of insulin: Secondary | ICD-10-CM

## 2023-05-28 NOTE — Patient Instructions (Signed)
Medication Instructions:  Your physician recommends that you continue on your current medications as directed. Please refer to the Current Medication list given to you today.  *If you need a refill on your cardiac medications before your next appointment, please call your pharmacy*   Lab Work: None   Testing/Procedures: None   Follow-Up: At [facility], you and your health needs are our priority.  As part of our continuing mission to provide you with exceptional heart care, we have created designated Provider Care Teams.  These Care Teams include your primary Cardiologist (physician) and Advanced Practice Providers (APPs -  Physician Assistants and Nurse Practitioners) who all work together to provide you with the care you need, when you need it.  Your next appointment:   9 month(s)  Provider:   Thomasene Ripple, DO     Other Instructions Diabetes Mellitus and Nutrition, Adult When you have diabetes, or diabetes mellitus, it is very important to have healthy eating habits because your blood sugar (glucose) levels are greatly affected by what you eat and drink. Eating healthy foods in the right amounts, at about the same times every day, can help you: Manage your blood glucose. Lower your risk of heart disease. Improve your blood pressure. Reach or maintain a healthy weight. What can affect my meal plan? Every person with diabetes is different, and each person has different needs for a meal plan. Your health care provider may recommend that you work with a dietitian to make a meal plan that is best for you. Your meal plan may vary depending on factors such as: The calories you need. The medicines you take. Your weight. Your blood glucose, blood pressure, and cholesterol levels. Your activity level. Other health conditions you have, such as heart or kidney disease. How do carbohydrates affect me? Carbohydrates, also called carbs, affect your blood glucose level more than any  other type of food. Eating carbs raises the amount of glucose in your blood. It is important to know how many carbs you can safely have in each meal. This is different for every person. Your dietitian can help you calculate how many carbs you should have at each meal and for each snack. How does alcohol affect me? Alcohol can cause a decrease in blood glucose (hypoglycemia), especially if you use insulin or take certain diabetes medicines by mouth. Hypoglycemia can be a life-threatening condition. Symptoms of hypoglycemia, such as sleepiness, dizziness, and confusion, are similar to symptoms of having too much alcohol. Do not drink alcohol if: Your health care provider tells you not to drink. You are pregnant, may be pregnant, or are planning to become pregnant. If you drink alcohol: Limit how much you have to: 0-1 drink a day for women. 0-2 drinks a day for men. Know how much alcohol is in your drink. In the [place] one drink equals one 12 oz bottle of beer (355 mL), one 5 oz glass of wine (148 mL), or one 1 oz glass of hard liquor (44 mL). Keep yourself hydrated with water, diet soda, or unsweetened iced tea. Keep in mind that regular soda, juice, and other mixers may contain a lot of sugar and must be counted as carbs. What are tips for following this plan?  Reading food labels Start by checking the serving size on the Nutrition Facts label of packaged foods and drinks. The number of calories and the amount of carbs, fats, and other nutrients listed on the label are based on one serving of the  item. Many items contain more than one serving per package. Check the total grams (g) of carbs in one serving. Check the number of grams of saturated fats and trans fats in one serving. Choose foods that have a low amount or none of these fats. Check the number of milligrams (mg) of salt (sodium) in one serving. Most people should limit total sodium intake to less than 2,300 mg per day. Always check the  nutrition information of foods labeled as "low-fat" or "nonfat." These foods may be higher in added sugar or refined carbs and should be avoided. Talk to your dietitian to identify your daily goals for nutrients listed on the label. Shopping Avoid buying canned, pre-made, or processed foods. These foods tend to be high in fat, sodium, and added sugar. Shop around the outside edge of the grocery store. This is where you will most often find fresh fruits and vegetables, bulk grains, fresh meats, and fresh dairy products. Cooking Use low-heat cooking methods, such as baking, instead of high-heat cooking methods, such as deep frying. Cook using healthy oils, such as olive, canola, or sunflower oil. Avoid cooking with butter, cream, or high-fat meats. Meal planning Eat meals and snacks regularly, preferably at the same times every day. Avoid going long periods of time without eating. Eat foods that are high in fiber, such as fresh fruits, vegetables, beans, and whole grains. Eat 4-6 oz (112-168 g) of lean protein each day, such as lean meat, chicken, fish, eggs, or tofu. One ounce (oz) (28 g) of lean protein is equal to: 1 oz (28 g) of meat, chicken, or fish. 1 egg.  cup (62 g) of tofu. Eat some foods each day that contain healthy fats, such as avocado, nuts, seeds, and fish. What foods should I eat? Fruits Berries. Apples. Oranges. Peaches. Apricots. Plums. Grapes. Mangoes. Papayas. Pomegranates. Kiwi. Cherries. Vegetables Leafy greens, including lettuce, spinach, kale, chard, collard greens, mustard greens, and cabbage. Beets. Cauliflower. Broccoli. Carrots. Green beans. Tomatoes. Peppers. Onions. Cucumbers. Brussels sprouts. Grains Whole grains, such as whole-wheat or whole-grain bread, crackers, tortillas, cereal, and pasta. Unsweetened oatmeal. Quinoa. Brown or wild rice. Meats and other proteins Seafood. Poultry without skin. Lean cuts of poultry and beef. Tofu. Nuts.  Seeds. Dairy Low-fat or fat-free dairy products such as milk, yogurt, and cheese. The items listed above may not be a complete list of foods and beverages you can eat and drink. Contact a dietitian for more information. What foods should I avoid? Fruits Fruits canned with syrup. Vegetables Canned vegetables. Frozen vegetables with butter or cream sauce. Grains Refined white flour and flour products such as bread, pasta, snack foods, and cereals. Avoid all processed foods. Meats and other proteins Fatty cuts of meat. Poultry with skin. Breaded or fried meats. Processed meat. Avoid saturated fats. Dairy Full-fat yogurt, cheese, or milk. Beverages Sweetened drinks, such as soda or iced tea. The items listed above may not be a complete list of foods and beverages you should avoid. Contact a dietitian for more information. Questions to ask a health care provider Do I need to meet with a certified diabetes care and education specialist? Do I need to meet with a dietitian? What number can I call if I have questions? When are the best times to check my blood glucose? Where to find more information: American Diabetes Association: diabetes.org Academy of Nutrition and Dietetics: eatright.[employer] of Diabetes and Digestive and Kidney Diseases: StageSync.si Association of Diabetes Care & Education Specialists: diabeteseducator.org Summary It  is important to have healthy eating habits because your blood sugar (glucose) levels are greatly affected by what you eat and drink. It is important to use alcohol carefully. A healthy meal plan will help you manage your blood glucose and lower your risk of heart disease. Your health care provider may recommend that you work with a dietitian to make a meal plan that is best for you. This information is not intended to replace advice given to you by your health care provider. Make sure you discuss any questions you have with your health care  provider. Document Revised: 04/03/2020 Document Reviewed: 04/03/2020 Elsevier Patient Education  2024 [employer].
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
## 2023-06-04 ENCOUNTER — Inpatient Hospital Stay: Admission: RE | Admit: 2023-06-04 | Payer: PRIVATE HEALTH INSURANCE | Source: Ambulatory Visit

## 2023-06-07 ENCOUNTER — Other Ambulatory Visit: Payer: Self-pay | Admitting: Family Medicine

## 2023-06-08 ENCOUNTER — Other Ambulatory Visit (INDEPENDENT_AMBULATORY_CARE_PROVIDER_SITE_OTHER): Payer: Self-pay | Admitting: Primary Care

## 2023-06-08 ENCOUNTER — Other Ambulatory Visit (HOSPITAL_COMMUNITY): Payer: Self-pay

## 2023-06-08 ENCOUNTER — Ambulatory Visit: Payer: Medicaid Other | Attending: Family Medicine | Admitting: Pharmacist

## 2023-06-08 ENCOUNTER — Other Ambulatory Visit: Payer: Self-pay

## 2023-06-08 DIAGNOSIS — E1139 Type 2 diabetes mellitus with other diabetic ophthalmic complication: Secondary | ICD-10-CM | POA: Diagnosis not present

## 2023-06-08 DIAGNOSIS — H33001 Unspecified retinal detachment with retinal break, right eye: Secondary | ICD-10-CM

## 2023-06-08 DIAGNOSIS — Z794 Long term (current) use of insulin: Secondary | ICD-10-CM

## 2023-06-08 DIAGNOSIS — Z7985 Long-term (current) use of injectable non-insulin antidiabetic drugs: Secondary | ICD-10-CM

## 2023-06-08 DIAGNOSIS — H3521 Other non-diabetic proliferative retinopathy, right eye: Secondary | ICD-10-CM

## 2023-06-08 MED ORDER — TRESIBA FLEXTOUCH 100 UNIT/ML ~~LOC~~ SOPN
36.0000 [IU] | PEN_INJECTOR | Freq: Every day | SUBCUTANEOUS | 0 refills | Status: DC
  Filled 2023-06-08 – 2023-06-24 (×4): qty 9, 25d supply, fill #0

## 2023-06-08 NOTE — Telephone Encounter (Signed)
Requested medications are due for refill today.  See note  Requested medications are on the active medications list.  yes  Last refill. 05/21/2023 2/ 1 rf  Future visit scheduled.   yes  Notes to clinic.  Pharmacy comment: +NOT CVRD_USE DEXCOM CONTINUOUS GLUCOSEM ONITORING SYSTEM NON-FORMULARY DRUG, CONTACT PRESCRIBER (PHARMACY HELP DESK [phone]) NON-SPECIALTY DRUG    Requested Prescriptions  Pending Prescriptions Disp Refills   Continuous Glucose Sensor (FREESTYLE LIBRE 3 SENSOR) MISC 2 each 1    Sig: Place 1 sensor on the skin every 14 days. Use to check glucose continuously.     Endocrinology: Diabetes - Testing Supplies Passed - 06/08/2023  4:23 PM      Passed - Valid encounter within last 12 months    Recent Outpatient Visits           Today    [facility] Lois Huxley, Jeannett Senior L, RPH-CPP   1 month ago Type 2 diabetes mellitus with other ophthalmic complication, with long-term current use of insulin (HCC)   [place] [facility] [name], NP   2 months ago Secondary hypertension   [place] [facility] [name], NP   3 months ago Type 2 diabetes mellitus with other ophthalmic complication, with long-term current use of insulin [facility])   [facility] [place], Jeannett Senior L, RPH-CPP   4 months ago Type 2 diabetes mellitus with other ophthalmic complication, with long-term current use of insulin [facility])   [place] [facility] [name], NP       Future Appointments             In 1 week Randa Evens Kinnie Scales, NP [facility]   In 1 month Lois Huxley, Cornelius Moras, RPH-CPP [facility]   In 2 months Randa Evens, Kinnie Scales, NP  [facility]   In 6 months Margo Aye, Duanne Guess, MD [facility]

## 2023-06-08 NOTE — Progress Notes (Unsigned)
    S:    62 y.o. male who presents for diabetes evaluation, education, and management. PMH is significant for  coronary artery disease status post PCI, diabetes mellitus, hypertension, hyperlipidemia, SVT, obesity, and post surgical retinal detachment.  Patient was referred and last seen by Primary Care Provider, Marcelino Duster, on 05/06/23. Last seen by pharmacist on 02/23/2023. At last visit, A1c 10.2%, up from 8.7% 4 months ago .   Patient arrives in good spirits and presents without any assistance. Since last PCP visit, pt has drastically modified his diet. He admits today that his recent A1c result was due in large part by dietary indiscretion. He also started Ozempic at the 2mg  weekly dose ~2 weeks ago. Denies any abdominal pain, NV. Continues to be compliant with [place]. He brings his CGM in today for review.   Family/Social History:  Fhx: stroke, cancer Tobacco: current smoker  Current diabetes medications include: Tresiba 36 units daily, Ozempic 2 mg weekly Patient reports adherence to taking all medications as prescribed.   Insurance coverage: BCBS, [place] Medicaid  Patient denies hypoglycemic events.  Patient denies nocturia (nighttime urination).  Patient reports neuropathy (nerve pain). Patient reports visual changes. Patient reports self foot exams.   Patient reported dietary habits:  -Has stopped eating foods with added sugars. Has stopped drinking sugar-sweetened beverages.  -Denies recent intake of starch or white carbohydrates  -Eats mostly baked proteins (names salmon) and salads     O:  Libre3 CGM Download today from 05/25/2023 - 06/08/23 Glucose Variability: 6.8% (goal <36%) Time in Goal:  - Time in range 70-180: 86% - Time above range: 14% - Time below range: 0%  Lab Results  Component Value Date   HGBA1C 10.2 (A) 05/06/2023   There were no vitals filed for this visit.  Lipid Panel     Component Value Date/Time   CHOL 194 05/06/2023 1518   TRIG 107  05/06/2023 1518   HDL 42 05/06/2023 1518   CHOLHDL 4.6 05/06/2023 1518   CHOLHDL 6.6 07/12/2014 0330   VLDL 28 07/12/2014 0330   LDLCALC 133 (H) 05/06/2023 1518    Clinical Atherosclerotic Cardiovascular Disease (ASCVD): Yes  The ASCVD Risk score (Arnett DK, et al., 2019) failed to calculate for the following reasons:   The patient has a prior MI or stroke diagnosis   Patient is participating in a Managed Medicaid Plan: no   A/P: Diabetes longstanding currently uncontrolled based on A1c, however, CGM data reveals improvement. Patient is able to verbalize appropriate hypoglycemia management plan. Medication adherence appears to be appropriate.  -Continued current regimen. -Patient educated on purpose, proper use, and potential adverse effects of Ozempic, Tresiba.  -Extensively discussed pathophysiology of diabetes, recommended lifestyle interventions, dietary effects on blood sugar control.  -Counseled on s/sx of and management of hypoglycemia.  -Next A1c anticipated 07/2023.   Written patient instructions provided. Patient verbalized understanding of treatment plan.  Total time in face to face counseling 30 minutes.    Follow-up:  Pharmacist in 1 month. PCP clinic visit on 08/09/2023  Butch Penny, PharmD, BCACP, CPP Clinical Pharmacist [facility] [phone]

## 2023-06-08 NOTE — Telephone Encounter (Signed)
Requested Prescriptions  Pending Prescriptions Disp Refills   insulin degludec (TRESIBA FLEXTOUCH) 100 UNIT/ML FlexTouch Pen 9 mL 0    Sig: Inject 36 Units into the skin daily.     Endocrinology:  Diabetes - Insulins Failed - 06/07/2023  5:36 PM      Failed - HBA1C is between 0 and 7.9 and within 180 days    Hemoglobin A1C  Date Value Ref Range Status  12/28/2022 8.6  Final   HbA1c, POC (controlled diabetic range)  Date Value Ref Range Status  05/06/2023 10.2 (A) 0.0 - 7.0 % Final         Passed - Valid encounter within last 6 months    Recent Outpatient Visits           Today    [facility] [place], [place] L, RPH-CPP   1 month ago Type 2 diabetes mellitus with other ophthalmic complication, with long-term current use of insulin (HCC)   [place] [facility] [name], NP   2 months ago Secondary hypertension   [place] [facility] [name], NP   3 months ago Type 2 diabetes mellitus with other ophthalmic complication, with long-term current use of insulin [facility])   [place] [facility] [place], [place] L, RPH-CPP   4 months ago Type 2 diabetes mellitus with other ophthalmic complication, with long-term current use of insulin [facility])   [place] [facility] [name], NP       Future Appointments             In 1 week Randa Evens Kinnie Scales, NP [facility]   In 1 month Lois Huxley, Cornelius Moras, RPH-CPP [facility]   In 2 months Randa Evens, Kinnie Scales, NP  [facility]   In 6 months Margo Aye, Duanne Guess, MD [facility] at [facility]

## 2023-06-09 ENCOUNTER — Encounter: Payer: Self-pay | Admitting: Pharmacist

## 2023-06-11 ENCOUNTER — Other Ambulatory Visit (HOSPITAL_COMMUNITY): Payer: Self-pay

## 2023-06-11 ENCOUNTER — Other Ambulatory Visit: Payer: Self-pay | Admitting: Pharmacist

## 2023-06-11 DIAGNOSIS — E1139 Type 2 diabetes mellitus with other diabetic ophthalmic complication: Secondary | ICD-10-CM

## 2023-06-11 MED ORDER — DEXCOM G7 SENSOR MISC
6 refills | Status: AC
Start: 2023-06-11 — End: ?
  Filled 2023-06-11: qty 1, 10d supply, fill #0
  Filled 2023-06-21: qty 3, 30d supply, fill #1
  Filled 2023-06-21: qty 1, 30d supply, fill #1
  Filled 2023-06-30: qty 1, 10d supply, fill #1
  Filled 2023-07-04: qty 1, 30d supply, fill #1
  Filled 2023-07-10 – 2023-07-26 (×4): qty 1, 30d supply, fill #2
  Filled 2023-07-31: qty 3, 30d supply, fill #2
  Filled 2023-08-03 – 2023-08-17 (×2): qty 3, 30d supply, fill #3
  Filled 2023-09-07 – 2023-09-25 (×3): qty 3, 30d supply, fill #4

## 2023-06-11 MED ORDER — FREESTYLE LIBRE 3 SENSOR MISC
1 refills | Status: DC
  Filled 2023-06-11: qty 2, 28d supply, fill #0

## 2023-06-11 MED ORDER — DEXCOM G7 RECEIVER DEVI
0 refills | Status: DC
Start: 2023-06-11 — End: 2023-06-21
  Filled 2023-06-11: qty 1, 30d supply, fill #0

## 2023-06-11 NOTE — Telephone Encounter (Signed)
Would you be able to assist with this?

## 2023-06-14 ENCOUNTER — Other Ambulatory Visit (INDEPENDENT_AMBULATORY_CARE_PROVIDER_SITE_OTHER): Payer: Self-pay | Admitting: Primary Care

## 2023-06-15 ENCOUNTER — Other Ambulatory Visit: Payer: Self-pay

## 2023-06-17 ENCOUNTER — Encounter (INDEPENDENT_AMBULATORY_CARE_PROVIDER_SITE_OTHER): Payer: Self-pay

## 2023-06-17 ENCOUNTER — Ambulatory Visit (INDEPENDENT_AMBULATORY_CARE_PROVIDER_SITE_OTHER): Payer: PRIVATE HEALTH INSURANCE | Admitting: Primary Care

## 2023-06-18 ENCOUNTER — Inpatient Hospital Stay: Admission: RE | Admit: 2023-06-18 | Payer: PRIVATE HEALTH INSURANCE | Source: Ambulatory Visit

## 2023-06-21 ENCOUNTER — Other Ambulatory Visit (INDEPENDENT_AMBULATORY_CARE_PROVIDER_SITE_OTHER): Payer: Self-pay | Admitting: Primary Care

## 2023-06-21 ENCOUNTER — Other Ambulatory Visit: Payer: Self-pay | Admitting: Pharmacist

## 2023-06-21 ENCOUNTER — Other Ambulatory Visit: Payer: Self-pay

## 2023-06-21 ENCOUNTER — Encounter (HOSPITAL_COMMUNITY): Payer: Self-pay | Admitting: Pharmacist

## 2023-06-21 ENCOUNTER — Other Ambulatory Visit (HOSPITAL_COMMUNITY): Payer: Self-pay

## 2023-06-21 DIAGNOSIS — E1139 Type 2 diabetes mellitus with other diabetic ophthalmic complication: Secondary | ICD-10-CM

## 2023-06-21 MED ORDER — DEXCOM G7 RECEIVER DEVI
0 refills | Status: DC
  Filled 2023-06-21 – 2023-06-24 (×2): qty 1, fill #0
  Filled 2023-06-30 – 2023-07-04 (×2): qty 1, 30d supply, fill #0

## 2023-06-22 ENCOUNTER — Other Ambulatory Visit: Payer: Self-pay

## 2023-06-22 ENCOUNTER — Encounter: Payer: Self-pay | Admitting: Pharmacist

## 2023-06-23 ENCOUNTER — Other Ambulatory Visit: Payer: Self-pay

## 2023-06-24 ENCOUNTER — Other Ambulatory Visit (INDEPENDENT_AMBULATORY_CARE_PROVIDER_SITE_OTHER): Payer: Self-pay | Admitting: Primary Care

## 2023-06-24 ENCOUNTER — Other Ambulatory Visit: Payer: Self-pay

## 2023-06-25 ENCOUNTER — Other Ambulatory Visit: Payer: Self-pay

## 2023-06-28 ENCOUNTER — Other Ambulatory Visit (HOSPITAL_COMMUNITY): Payer: Self-pay

## 2023-06-28 MED ORDER — OFLOXACIN 0.3 % OP SOLN
1.0000 [drp] | Freq: Four times a day (QID) | OPHTHALMIC | 1 refills | Status: DC
  Filled 2023-06-28 – 2023-07-04 (×3): qty 5, 25d supply, fill #0
  Filled 2023-07-10 – 2023-08-03 (×3): qty 5, 25d supply, fill #1

## 2023-06-30 ENCOUNTER — Other Ambulatory Visit: Payer: Self-pay | Admitting: Primary Care

## 2023-06-30 ENCOUNTER — Other Ambulatory Visit (HOSPITAL_COMMUNITY): Payer: Self-pay

## 2023-06-30 ENCOUNTER — Other Ambulatory Visit: Payer: Self-pay

## 2023-06-30 ENCOUNTER — Other Ambulatory Visit (INDEPENDENT_AMBULATORY_CARE_PROVIDER_SITE_OTHER): Payer: Self-pay | Admitting: Primary Care

## 2023-06-30 MED ORDER — TRESIBA FLEXTOUCH 100 UNIT/ML ~~LOC~~ SOPN
36.0000 [IU] | PEN_INJECTOR | Freq: Every day | SUBCUTANEOUS | 0 refills | Status: DC
  Filled 2023-06-30 – 2023-07-14 (×5): qty 9, 25d supply, fill #0

## 2023-07-01 ENCOUNTER — Other Ambulatory Visit (HOSPITAL_COMMUNITY): Payer: Self-pay

## 2023-07-04 ENCOUNTER — Other Ambulatory Visit (HOSPITAL_COMMUNITY): Payer: Self-pay

## 2023-07-04 ENCOUNTER — Other Ambulatory Visit (INDEPENDENT_AMBULATORY_CARE_PROVIDER_SITE_OTHER): Payer: Self-pay | Admitting: Primary Care

## 2023-07-05 ENCOUNTER — Other Ambulatory Visit (HOSPITAL_COMMUNITY): Payer: Self-pay

## 2023-07-05 ENCOUNTER — Other Ambulatory Visit: Payer: Self-pay

## 2023-07-06 ENCOUNTER — Other Ambulatory Visit: Payer: Self-pay

## 2023-07-10 ENCOUNTER — Other Ambulatory Visit (INDEPENDENT_AMBULATORY_CARE_PROVIDER_SITE_OTHER): Payer: Self-pay | Admitting: Primary Care

## 2023-07-12 ENCOUNTER — Encounter: Payer: Self-pay | Admitting: *Deleted

## 2023-07-12 ENCOUNTER — Other Ambulatory Visit: Payer: Self-pay

## 2023-07-12 NOTE — Progress Notes (Signed)
Pt attended 12/28/22 screening event where his b/p was 150/86 and his blood sugar was 296 and his A1C was 8.6. Per chart review, pt also attended the 04/01/23 screening event where his b/p was 130/80 and then saw his PCP, Gwinda Passe, NP, [facility] 05/06/23 where his b/p was 132/93 and his A1C was 10.2. Pt was then referred to Dr. Servando Salina on 05/28/23 were his b/p was 132/80 and to the Pharm D on 06/08/23 who noted "Since last PCP visit, pt has drastically modified his diet. He admits today that his recent A1c result was due in large part by dietary indiscretion. He also started Ozempic at the 2mg  weekly dose ~2 weeks ago. Denies any abdominal pain, NV. Continues to be compliant with [place]. He brings his CGM in today for review." Pt had appt with ophthalmology today and has a future appt with Pharmacist on 07/16/23 and his PCP on 08/09/23. CM RN notes documented on 04/27/23 demonstrate pt's SDOH needs for food and transportation were addressed and the pt was referred to managed Medicaid. No additional health equity team support scheduled at this time.

## 2023-07-14 ENCOUNTER — Other Ambulatory Visit (HOSPITAL_COMMUNITY): Payer: Self-pay

## 2023-07-14 ENCOUNTER — Other Ambulatory Visit: Payer: Self-pay

## 2023-07-16 ENCOUNTER — Ambulatory Visit: Payer: Self-pay | Admitting: Pharmacist

## 2023-07-22 ENCOUNTER — Other Ambulatory Visit: Payer: Self-pay

## 2023-07-23 ENCOUNTER — Other Ambulatory Visit: Payer: Self-pay | Admitting: Primary Care

## 2023-07-23 ENCOUNTER — Other Ambulatory Visit: Payer: Self-pay

## 2023-07-23 ENCOUNTER — Other Ambulatory Visit (HOSPITAL_COMMUNITY): Payer: Self-pay

## 2023-07-23 MED ORDER — TRESIBA FLEXTOUCH 100 UNIT/ML ~~LOC~~ SOPN
36.0000 [IU] | PEN_INJECTOR | Freq: Every day | SUBCUTANEOUS | 0 refills | Status: DC
  Filled 2023-07-23 – 2023-08-03 (×5): qty 9, 25d supply, fill #0

## 2023-07-24 ENCOUNTER — Other Ambulatory Visit (HOSPITAL_COMMUNITY): Payer: Self-pay

## 2023-07-26 ENCOUNTER — Other Ambulatory Visit: Payer: Self-pay

## 2023-08-01 NOTE — Progress Notes (Unsigned)
S:     No chief complaint on file.  62 y.o. male who presents for diabetes evaluation, education, and management. PMH is significant for  coronary artery disease status post PCI, diabetes mellitus, hypertension, hyperlipidemia, SVT, obesity, and post surgical retinal detachment. Patient was referred and last seen by Primary Care Provider, Gwinda Passe, on 05/06/23. At last visit, A1c  was 10.2%, up from 8.7% 4 months ago. Patient was last seen by Pharmacist, Butch Penny, on 06/08/2023. At that visit no changes were made due to CGM showing improvement.      Patient arrives in *** good spirits and presents without *** any assistance. ***Patient is accompanied by ***.   Patient reports Diabetes was diagnosed in ***.   Family/Social History:  Fhx: stroke, cancer Tobacco: current smoker  Current diabetes medications include: Tresiba 36 units daily, Ozempic 2 mg weekly  Current hypertension medications include: Amlodipine 10 mg daily, Carvedilol 25 mg BID,  Current hyperlipidemia medications include: Atorvastatin 80 mg daily  Patient reports adherence to taking all medications as prescribed.  *** Patient denies adherence with medications, reports missing *** medications *** times per week, on average.  Insurance coverage: ***  Patient {Actions; denies-reports:120008} hypoglycemic events.  Reported home fasting blood sugars: ***  Reported 2 hour post-meal/random blood sugars: ***.  Patient {Actions; denies-reports:120008} nocturia (nighttime urination).  Patient {Actions; denies-reports:120008} neuropathy (nerve pain). Patient {Actions; denies-reports:120008} visual changes. Patient {Actions; denies-reports:120008} self foot exams.   Patient reported dietary habits: Eats *** meals/day Breakfast: *** Lunch: *** Dinner: *** Snacks: *** Drinks: ***  Patient-reported exercise habits: ***   O:  7 day average blood glucose: ***  Libre3 *** CGM Download today *** on  *** % Time CGM is active: ***% Average Glucose: *** mg/dL Glucose Management Indicator: ***  Glucose Variability: ***% (goal <36%) Time in Goal:  - Time in range 70-180: ***% - Time above range: ***% - Time below range: ***% Observed patterns:   Lab Results  Component Value Date   HGBA1C 10.2 (A) 05/06/2023   There were no vitals filed for this visit.  Lipid Panel     Component Value Date/Time   CHOL 194 05/06/2023 1518   TRIG 107 05/06/2023 1518   HDL 42 05/06/2023 1518   CHOLHDL 4.6 05/06/2023 1518   CHOLHDL 6.6 07/12/2014 0330   VLDL 28 07/12/2014 0330   LDLCALC 133 (H) 05/06/2023 1518    Clinical Atherosclerotic Cardiovascular Disease (ASCVD): Yes  The ASCVD Risk score (Arnett DK, et al., 2019) failed to calculate for the following reasons:   The patient has a prior MI or stroke diagnosis   A/P: Diabetes longstanding *** currently ***. Patient is *** able to verbalize appropriate hypoglycemia management plan. Medication adherence appears ***. Control is suboptimal due to ***. -{Meds adjust:18428} basal insulin *** Lantus/Basaglar/Semglee (insulin glargine) *** Tresiba (insulin degludec) from *** units to *** units daily in the morning. Patient will continue to titrate 1 unit every *** days if fasting blood sugar > 100mg /dl until fasting blood sugars reach goal or next visit.  -{Meds adjust:18428} rapid insulin *** Novolog (insulin aspart) *** Humalog (insulin lispro) from *** to ***.  -{Meds adjust:18428} GLP-1 *** Trulicity (dulaglutide) *** Ozempic (semaglutide) *** Mounjaro (tirzepatide) from *** mg to *** mg .  -{Meds adjust:18428} SGLT2-I *** Farxiga (dapagliflozin) *** Jardiance (empagliflozin) 10 mg. Counseled on sick day rules. -{Meds adjust:18428} metformin ***.  -Patient educated on purpose, proper use, and potential adverse effects of ***.  -Extensively discussed pathophysiology  of diabetes, recommended lifestyle interventions, dietary effects on blood sugar  control.  -Counseled on s/sx of and management of hypoglycemia.  -Next A1c anticipated 10/2023.   Written patient instructions provided. Patient verbalized understanding of treatment plan.  Total time in face to face counseling *** minutes.    Follow-up:  Pharmacist *** PCP clinic visit in *** Patient seen with ***

## 2023-08-02 ENCOUNTER — Other Ambulatory Visit: Payer: Self-pay

## 2023-08-02 ENCOUNTER — Other Ambulatory Visit (HOSPITAL_COMMUNITY): Payer: Self-pay

## 2023-08-02 ENCOUNTER — Ambulatory Visit: Payer: PRIVATE HEALTH INSURANCE | Admitting: Pharmacist

## 2023-08-03 ENCOUNTER — Other Ambulatory Visit: Payer: Self-pay

## 2023-08-03 ENCOUNTER — Other Ambulatory Visit (HOSPITAL_COMMUNITY): Payer: Self-pay

## 2023-08-03 ENCOUNTER — Other Ambulatory Visit (INDEPENDENT_AMBULATORY_CARE_PROVIDER_SITE_OTHER): Payer: Self-pay | Admitting: Primary Care

## 2023-08-05 ENCOUNTER — Other Ambulatory Visit: Payer: Self-pay

## 2023-08-06 ENCOUNTER — Telehealth (INDEPENDENT_AMBULATORY_CARE_PROVIDER_SITE_OTHER): Payer: Self-pay | Admitting: Primary Care

## 2023-08-06 NOTE — Telephone Encounter (Signed)
Called to inform pt about apt on 11/25. Pt will be seen at [address] due to an power outage.

## 2023-08-09 ENCOUNTER — Ambulatory Visit (INDEPENDENT_AMBULATORY_CARE_PROVIDER_SITE_OTHER): Payer: Self-pay | Admitting: Primary Care

## 2023-08-17 ENCOUNTER — Other Ambulatory Visit (HOSPITAL_COMMUNITY): Payer: Self-pay

## 2023-08-17 ENCOUNTER — Other Ambulatory Visit: Payer: Self-pay | Admitting: Primary Care

## 2023-08-18 ENCOUNTER — Other Ambulatory Visit: Payer: Self-pay

## 2023-08-18 ENCOUNTER — Other Ambulatory Visit (HOSPITAL_COMMUNITY): Payer: Self-pay

## 2023-08-18 MED ORDER — TRESIBA FLEXTOUCH 100 UNIT/ML ~~LOC~~ SOPN
36.0000 [IU] | PEN_INJECTOR | Freq: Every day | SUBCUTANEOUS | 0 refills | Status: DC
  Filled 2023-08-18 – 2023-09-25 (×5): qty 9, 25d supply, fill #0

## 2023-08-23 ENCOUNTER — Other Ambulatory Visit: Payer: Self-pay

## 2023-09-04 ENCOUNTER — Emergency Department (HOSPITAL_COMMUNITY)
Admission: EM | Admit: 2023-09-04 | Discharge: 2023-09-04 | Disposition: A | Discharge status: Home / Self Care | Payer: BC Managed Care – PPO | Attending: Emergency Medicine | Admitting: Emergency Medicine

## 2023-09-04 DIAGNOSIS — Y9389 Activity, other specified: Secondary | ICD-10-CM | POA: Insufficient documentation

## 2023-09-04 DIAGNOSIS — Z7982 Long term (current) use of aspirin: Secondary | ICD-10-CM | POA: Diagnosis not present

## 2023-09-04 DIAGNOSIS — I2510 Atherosclerotic heart disease of native coronary artery without angina pectoris: Secondary | ICD-10-CM | POA: Diagnosis not present

## 2023-09-04 DIAGNOSIS — Z794 Long term (current) use of insulin: Secondary | ICD-10-CM | POA: Diagnosis not present

## 2023-09-04 DIAGNOSIS — M7021 Olecranon bursitis, right elbow: Secondary | ICD-10-CM | POA: Insufficient documentation

## 2023-09-04 DIAGNOSIS — M25421 Effusion, right elbow: Secondary | ICD-10-CM | POA: Diagnosis present

## 2023-09-04 DIAGNOSIS — E119 Type 2 diabetes mellitus without complications: Secondary | ICD-10-CM | POA: Diagnosis not present

## 2023-09-04 DIAGNOSIS — Z8546 Personal history of malignant neoplasm of prostate: Secondary | ICD-10-CM | POA: Insufficient documentation

## 2023-09-04 NOTE — ED Notes (Signed)
Attempted to obtain SPO2 with vitals but sensor would not read

## 2023-09-04 NOTE — ED Provider Notes (Signed)
EMERGENCY DEPARTMENT AT [facility] Provider Note   CSN: [id number] Arrival date & time: 09/04/23  4098     History  Chief Complaint  Patient presents with   Joint Swelling    Dal Flaugh is a 62 y.o. male history of diabetes, CAD, NSTEMI, prostate cancer presenting with right elbow swelling for the past 3 days.  Patient [place] fevers, redness.  Patient does note in triage that he was having clear liquid drainage however denies this with me.  Patient states he is still able to move his elbow full range and feel his right upper extremity without any paresthesias or new onset weakness.  Patient denies any trauma.  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  Patient denies any pain with the swelling.  Patient has tried ice and thinks that this has helped.  Home Medications Prior to Admission medications   Medication Sig Start Date End Date Taking? Authorizing Provider  amLODipine (NORVASC) 10 MG tablet Take 1 tablet (10 mg total) by mouth daily. 05/21/23   [name], NP  aspirin EC 81 MG tablet Take 1 tablet (81 mg total) by mouth daily. 10/22/22   Mayers, Cari S, PA-C  atorvastatin (LIPITOR) 80 MG tablet Take 1 tablet (80 mg total) by mouth daily at 6 PM. 05/21/23   [name], NP  carvedilol (COREG) 25 MG tablet Take 1 tablet (25 mg total) by mouth 2 (two) times daily as directed. 03/15/23   Tobb, Kardie, DO  Continuous Glucose Receiver (DEXCOM G7 RECEIVER) DEVI Use to check glucose continuously. 06/21/23   Hoy Register, MD  Continuous Glucose Sensor (DEXCOM G7 SENSOR) MISC Place 1 sensor on the skin every 10 days. Use to check glucose continuously. 06/11/23   Drucilla Chalet, RPH-CPP  erythromycin ophthalmic ointment Place a 1/2 inch ribbon of ointment into the lower eyelid. 05/07/23   Dyanne Iha, MD  EYSUVIS 0.25 % SUSP Apply to eye daily. 01/10/23   [provider]  hydrocortisone cream 1  % Apply to affected area 2 times daily 09/25/22   Debby Freiberg, NP  insulin degludec (TRESIBA FLEXTOUCH) 100 UNIT/ML FlexTouch Pen Inject 36 Units into the skin daily. 08/18/23   [name], NP  Insulin Pen Needle (TRUEPLUS 5-BEVEL PEN NEEDLES) 32G X 4 MM MISC Inject 1 each into the skin daily. 10/22/22   Mayers, Cari S, PA-C  nitroGLYCERIN (NITROSTAT) 0.4 MG SL tablet Place 1 tablet (0.4 mg total) under the tongue every 5 (five) minutes x 3 doses as needed for chest pain. 10/22/22   Mayers, Cari S, PA-C  ofloxacin (OCUFLOX) 0.3 % ophthalmic solution Place 1 drop into the right eye 4 (four) times daily FOR 7 DAYS STARTING AFTER SURGERY 06/26/23     Semaglutide, 2 MG/DOSE, 8 MG/3ML SOPN Inject 2 mg as directed once a week. 05/06/23   Hoy Register, MD  sildenafil (REVATIO) 20 MG tablet Take 2-5 tablets (40-100 mg total) by mouth 1-2 hrs prior to sexual activity on an empty stomach. 11/11/22   Joline Maxcy, MD      Allergies    Ace inhibitors, Penicillin g benzathine & proc, and Penicillins    Review of Systems   Review of Systems  Physical Exam Updated Vital Signs BP (!) 160/113   Pulse 74   Temp 98.2 F (36.8 C) (Oral)   Resp 16   Ht 5\' 11"  (1.803 m)  Wt 97.4 kg   SpO2 100%   BMI 29.94 kg/m  Physical Exam Vitals reviewed.  Constitutional:      General: He is not in acute distress. Cardiovascular:     Rate and Rhythm: Normal rate.     Pulses: Normal pulses.  Musculoskeletal:     Comments: Right elbow: Obvious olecranon bursitis noted, 5 out of 5 shoulder adduction/abduction, elbow flexion/extension, wrist flexion/extension, grip; no bony tenderness or crepitus, no signs of open wounds Pain not out of proportion Soft compartments  Skin:    General: Skin is warm and dry.     Capillary Refill: Capillary refill takes less than 2 seconds.     Comments: Bursitis noted on right elbow that is soft to palpation, not warm, no signs of drainage, nontender to palpation   Neurological:     Mental Status: He is alert.     Comments: Sensation intact distally  Psychiatric:        Mood and Affect: Mood normal.     ED Results / Procedures / Treatments   Labs (all labs ordered are listed, but only abnormal results are displayed) Labs Reviewed - No data to display  EKG None  Radiology No results found.  Procedures Procedures    Medications Ordered in ED Medications - No data to display  ED Course/ Medical Decision Making/ A&P                                 Medical Decision Making  Damek Brazile 62 y.o. presented today for right elbow swelling. Working DDx that I considered at this time includes, but not limited to, bursitis, contusion, strain/sprain, fracture, dislocation, neurovascular compromise, septic joint, ischemic limb, compartment syndrome.  R/o DDx: contusion, strain/sprain, fracture, dislocation, neurovascular compromise, septic joint, ischemic limb, compartment syndrome: These are considered less likely due to history of present illness, physical exam, labs/imaging findings.  Review of prior external notes: 05/07/2023 ED  Unique Tests and My Interpretation: 05/07/23 ED  Social Determinants of Health: EtOH/Substance Abuse  Discussion with Independent Historian: None  Discussion of Management of Tests: None  Risk: Low: based on diagnostic testing/clinical impression and treatment plan  Risk Stratification Score: None  Plan: On exam patient was no acute distress stable vitals.  On exam patient was neuro vas intact and has obvious olecranon bursitis.  Patient denies any trauma and we had a discussion with the patient does not feel he needs x-rays at this time.  Patient's exam was ultimately benign without any concerning features along with his history and so recommended that he takes Tylenol every 6 hours needed for pain along with ice and rest over the next few days and suggested an elbow sleeve that he may get  over-the-counter.  Patient states he does rub his elbow a lot in the center console when he is traveling and so do suspect this may have caused his bursitis.  Patient was given return precautions. Patient stable for discharge at this time.  Patient verbalized understanding of plan.  This chart was dictated using voice recognition software.  Despite best efforts to proofread,  errors can occur which can change the documentation meaning.         Final Clinical Impression(s) / ED Diagnoses Final diagnoses:  Olecranon bursitis of right elbow    Rx / DC Orders ED Discharge Orders     None         Evlyn Kanner  T, PA-C 09/04/23 1303    Bethann Berkshire, MD 09/06/23 1042

## 2023-09-04 NOTE — Discharge Instructions (Addendum)
Please follow-up with her primary care provider in regards to be symptoms and ER visit.  Today your exam shows that you have bursitis of your elbow and you may take Tylenol every 6 hours as needed for pain along with ice and rest.  You may find elbow sleeves off of [employer] to help compress the elbow.  If symptoms change or worsen please return to the ER.

## 2023-09-04 NOTE — ED Triage Notes (Signed)
Patient reports right elbow swelling x 3 days Says clear liquid drainage Denies pain No limits in ROM

## 2023-09-07 ENCOUNTER — Other Ambulatory Visit (HOSPITAL_COMMUNITY): Payer: Self-pay

## 2023-09-09 ENCOUNTER — Other Ambulatory Visit: Payer: Self-pay

## 2023-09-09 ENCOUNTER — Other Ambulatory Visit (HOSPITAL_COMMUNITY): Payer: Self-pay

## 2023-09-13 ENCOUNTER — Other Ambulatory Visit: Payer: Self-pay

## 2023-09-14 ENCOUNTER — Other Ambulatory Visit: Payer: Self-pay

## 2023-09-14 DIAGNOSIS — E1169 Type 2 diabetes mellitus with other specified complication: Secondary | ICD-10-CM

## 2023-09-16 ENCOUNTER — Other Ambulatory Visit: Payer: Medicaid Other

## 2023-09-17 LAB — COMPREHENSIVE METABOLIC PANEL
AG Ratio: 1.1 (calc) (ref 1.0–2.5)
ALT: 15 U/L (ref 9–46)
AST: 12 U/L (ref 10–35)
Albumin: 4.1 g/dL (ref 3.6–5.1)
Alkaline phosphatase (APISO): 82 U/L (ref 35–144)
BUN: 11 mg/dL (ref 7–25)
CO2: 24 mmol/L (ref 20–32)
Calcium: 9.4 mg/dL (ref 8.6–10.3)
Chloride: 105 mmol/L (ref 98–110)
Creat: 0.85 mg/dL (ref 0.70–1.35)
Globulin: 3.6 g/dL (ref 1.9–3.7)
Glucose, Bld: 158 mg/dL — ABNORMAL HIGH (ref 65–99)
Potassium: 3.6 mmol/L (ref 3.5–5.3)
Sodium: 138 mmol/L (ref 135–146)
Total Bilirubin: 0.5 mg/dL (ref 0.2–1.2)
Total Protein: 7.7 g/dL (ref 6.1–8.1)

## 2023-09-17 LAB — HEMOGLOBIN A1C
Hgb A1c MFr Bld: 7.7 %{Hb} — ABNORMAL HIGH (ref <5.7)
Mean Plasma Glucose: 174 mg/dL
eAG (mmol/L): 9.7 mmol/L

## 2023-09-17 LAB — LIPID PANEL
Cholesterol: 271 mg/dL — ABNORMAL HIGH (ref <200)
HDL: 41 mg/dL (ref >=40)
LDL Cholesterol (Calc): 205 mg/dL — ABNORMAL HIGH
Non-HDL Cholesterol (Calc): 230 mg/dL — ABNORMAL HIGH (ref <130)
Total CHOL/HDL Ratio: 6.6 (calc) — ABNORMAL HIGH (ref <5.0)
Triglycerides: 111 mg/dL (ref <150)

## 2023-09-17 LAB — MICROALBUMIN / CREATININE URINE RATIO
Creatinine, Urine: 218 mg/dL (ref 20–320)
Microalb Creat Ratio: 44 mg/g{creat} — ABNORMAL HIGH (ref <30)
Microalb, Ur: 9.6 mg/dL

## 2023-09-22 ENCOUNTER — Ambulatory Visit (INDEPENDENT_AMBULATORY_CARE_PROVIDER_SITE_OTHER): Payer: Self-pay | Admitting: Primary Care

## 2023-09-24 ENCOUNTER — Other Ambulatory Visit: Payer: Self-pay

## 2023-09-24 ENCOUNTER — Other Ambulatory Visit (HOSPITAL_COMMUNITY): Payer: Self-pay

## 2023-09-24 ENCOUNTER — Ambulatory Visit: Payer: PRIVATE HEALTH INSURANCE | Admitting: "Endocrinology

## 2023-09-25 ENCOUNTER — Other Ambulatory Visit (HOSPITAL_COMMUNITY): Payer: Self-pay

## 2023-09-27 ENCOUNTER — Other Ambulatory Visit (HOSPITAL_COMMUNITY): Payer: Self-pay

## 2023-09-27 ENCOUNTER — Encounter (HOSPITAL_COMMUNITY): Payer: Self-pay

## 2023-09-27 ENCOUNTER — Other Ambulatory Visit: Payer: Self-pay

## 2023-09-28 ENCOUNTER — Other Ambulatory Visit (HOSPITAL_COMMUNITY): Payer: Self-pay

## 2023-09-28 ENCOUNTER — Other Ambulatory Visit: Payer: Self-pay

## 2023-09-30 ENCOUNTER — Other Ambulatory Visit (HOSPITAL_COMMUNITY): Payer: Self-pay

## 2023-10-04 ENCOUNTER — Other Ambulatory Visit (HOSPITAL_COMMUNITY): Payer: Self-pay

## 2023-10-10 ENCOUNTER — Other Ambulatory Visit (HOSPITAL_COMMUNITY): Payer: Self-pay

## 2023-10-11 ENCOUNTER — Other Ambulatory Visit (HOSPITAL_COMMUNITY): Payer: Self-pay

## 2023-10-11 ENCOUNTER — Other Ambulatory Visit: Payer: Self-pay

## 2023-10-12 ENCOUNTER — Other Ambulatory Visit: Payer: Self-pay

## 2023-10-15 ENCOUNTER — Other Ambulatory Visit: Payer: Self-pay

## 2023-10-15 ENCOUNTER — Other Ambulatory Visit (HOSPITAL_COMMUNITY): Payer: Self-pay

## 2023-10-20 ENCOUNTER — Other Ambulatory Visit: Payer: Self-pay

## 2023-10-26 ENCOUNTER — Other Ambulatory Visit: Payer: Self-pay | Admitting: Primary Care

## 2023-10-26 DIAGNOSIS — I10 Essential (primary) hypertension: Secondary | ICD-10-CM

## 2023-10-26 DIAGNOSIS — E782 Mixed hyperlipidemia: Secondary | ICD-10-CM

## 2023-10-26 NOTE — Telephone Encounter (Signed)
Requested Prescriptions  Pending Prescriptions Disp Refills   amLODipine (NORVASC) 10 MG tablet [Pharmacy Med Name: AMLODIPINE BESYLATE 10 MG TAB] 90 tablet 0    Sig: TAKE 1 TABLET BY MOUTH EVERY DAY     Cardiovascular: Calcium Channel Blockers 2 Failed - 10/26/2023  4:00 PM      Failed - Last BP in normal range    BP Readings from Last 1 Encounters:  09/04/23 (!) 160/113         Passed - Last Heart Rate in normal range    Pulse Readings from Last 1 Encounters:  09/04/23 74         Passed - Valid encounter within last 6 months    Recent Outpatient Visits           4 months ago Type 2 diabetes mellitus with other ophthalmic complication, with long-term current use of insulin (HCC)   [place] [facility] [place] Lois Huxley, [place] L, RPH-CPP   5 months ago Type 2 diabetes mellitus with other ophthalmic complication, with long-term current use of insulin [facility])   [place] [facility] [name], NP   7 months ago Secondary hypertension   [place] [facility] [name], NP   8 months ago Type 2 diabetes mellitus with other ophthalmic complication, with long-term current use of insulin (HCC)   [place] [facility] [place] Drucilla Chalet, RPH-CPP   9 months ago Type 2 diabetes mellitus with other ophthalmic complication, with long-term current use of insulin (HCC)   [place] [facility] [name], NP       Future Appointments             In 1 month Alt[place]e [place], MD [facility]   In 1 month Margo Aye, Duanne Guess, MD [facility] [place]             atorvastatin (LIPITOR) 80 MG tablet [Pharmacy Med Name: ATORVASTATIN 80 MG TABLET] 90 tablet 0    Sig: TAKE 1 TABLET BY MOUTH EVERY DAY AT 6PM IN THE EVENING     Cardiovascular:  Antilipid - Statins Failed  - 10/26/2023  4:00 PM      Failed - Lipid Panel in normal range within the last 12 months    Cholesterol, Total  Date Value Ref Range Status  05/06/2023 194 100 - 199 mg/dL Final   Cholesterol  Date Value Ref Range Status  09/16/2023 271 (H) <200 mg/dL Final   LDL Cholesterol (Calc)  Date Value Ref Range Status  09/16/2023 205 (H) mg/dL (calc) Final    Comment:    LDL-C levels > or = 190 mg/dL may indicate familial  hypercholesterolemia (FH). Clinical assessment and  measurement of blood lipid levels should be  considered for all first degree relatives of patients with an FH diagnosis. LDL Cholesterol (LDL-C) levels > or = 300 mg/dL may indicate homozygous familial hypercholesterolemia (HoFH). Untreated,  these extremely high LDL-C levels can result in premature CV events and mortality. Patients should be identified early and provided appropriate interventions to reduce the cumulative LDL-C burden from birth. . For questions about testing for familial hypercholesterolemia, please call Engineer, materials at 1.866.GENE.INFO. Wardell Honour, et al. J National Lipid Association  Recommendations for Patient-Centered Management of Dyslipidemia: Part 1 Journal of  Clinical  Lipidology [id number]), 129-169. Cuchel, M. et al. (2014). Homozygous familial hypercholesterolaemia: new insights and guidance for clinicians to improve detection and clinical management. European Heart Journal, 35(32), [phone]. Reference range: <100 . Desirable range <100 mg/dL for primary prevention;   <70 mg/dL for patients with CHD or diabetic patients  with > or = 2 CHD risk factors. Marland Kitchen LDL-C is now calculated using the Martin-Hopkins  calculation, which is a validated novel method providing  better accuracy than the Friedewald equation in the  estimation of LDL-C.  Horald Pollen et al. Lenox Ahr. 4696;295(28): 2061-2068  (http://education.QuestDiagnostics.com/faq/FAQ164)    HDL  Date Value Ref  Range Status  09/16/2023 41 > OR = 40 mg/dL Final  41/32/4401 42 >02 mg/dL Final   Triglycerides  Date Value Ref Range Status  09/16/2023 111 <150 mg/dL Final         Passed - Patient is not pregnant      Passed - Valid encounter within last 12 months    Recent Outpatient Visits           4 months ago Type 2 diabetes mellitus with other ophthalmic complication, with long-term current use of insulin (HCC)   [place] [facility] [place]. [facility] Lois Huxley, [place] L, RPH-CPP   5 months ago Type 2 diabetes mellitus with other ophthalmic complication, with long-term current use of insulin [facility])   [place] [facility] [name], NP   7 months ago Secondary hypertension   [place] [facility] [name], NP   8 months ago Type 2 diabetes mellitus with other ophthalmic complication, with long-term current use of insulin (HCC)   [place] [facility] [place]. [facility] Lois Huxley, [place] L, RPH-CPP   9 months ago Type 2 diabetes mellitus with other ophthalmic complication, with long-term current use of insulin (HCC)   [place] [facility] [name], NP       Future Appointments             In 1 month Altamese [place], MD [facility]   In 1 month Margo Aye, Duanne Guess, MD [facility] at [facility]

## 2023-11-07 ENCOUNTER — Other Ambulatory Visit (INDEPENDENT_AMBULATORY_CARE_PROVIDER_SITE_OTHER): Payer: Self-pay | Admitting: Primary Care

## 2023-11-07 ENCOUNTER — Other Ambulatory Visit: Payer: Self-pay | Admitting: Physician Assistant

## 2023-11-07 DIAGNOSIS — I252 Old myocardial infarction: Secondary | ICD-10-CM

## 2023-11-08 ENCOUNTER — Other Ambulatory Visit: Payer: Self-pay | Admitting: Physician Assistant

## 2023-11-08 ENCOUNTER — Other Ambulatory Visit (INDEPENDENT_AMBULATORY_CARE_PROVIDER_SITE_OTHER): Payer: Self-pay | Admitting: Primary Care

## 2023-11-08 DIAGNOSIS — I10 Essential (primary) hypertension: Secondary | ICD-10-CM

## 2023-11-08 DIAGNOSIS — I252 Old myocardial infarction: Secondary | ICD-10-CM

## 2023-11-08 NOTE — Telephone Encounter (Signed)
 Will forward to provider

## 2023-11-11 ENCOUNTER — Encounter (INDEPENDENT_AMBULATORY_CARE_PROVIDER_SITE_OTHER): Payer: Self-pay | Admitting: Primary Care

## 2023-11-11 ENCOUNTER — Ambulatory Visit (INDEPENDENT_AMBULATORY_CARE_PROVIDER_SITE_OTHER): Payer: PRIVATE HEALTH INSURANCE | Admitting: Primary Care

## 2023-11-11 DIAGNOSIS — I10 Essential (primary) hypertension: Secondary | ICD-10-CM

## 2023-11-11 MED ORDER — SEMAGLUTIDE (2 MG/DOSE) 8 MG/3ML ~~LOC~~ SOPN
2.0000 mg | PEN_INJECTOR | SUBCUTANEOUS | 1 refills | Status: DC
Start: 2023-11-11 — End: 2024-01-10

## 2023-11-11 MED ORDER — CARVEDILOL 25 MG PO TABS: 25.0000 mg | ORAL_TABLET | Freq: Two times a day (BID) | ORAL | 1 refills | Status: DC

## 2023-11-11 MED ORDER — TRESIBA FLEXTOUCH 100 UNIT/ML ~~LOC~~ SOPN
36.0000 [IU] | PEN_INJECTOR | Freq: Every day | SUBCUTANEOUS | 1 refills | Status: DC
Start: 2023-11-11 — End: 2024-01-10

## 2023-11-11 MED ORDER — AMLODIPINE BESYLATE 10 MG PO TABS: 10.0000 mg | ORAL_TABLET | Freq: Every day | ORAL | 0 refills | Status: DC

## 2023-11-11 NOTE — Progress Notes (Deleted)
207.4 

## 2023-11-11 NOTE — Progress Notes (Signed)
 [facility]  Thomas Watts, is a 63 y.o. male  ZOX:[id number]  [id number]  DOB - September 02, 1961  Chief Complaint  Patient presents with   Medical Management of Chronic Issues   Medication Refill       Subjective:   Thomas Watts is a 63 y.o. male here today for a follow up visit.HTN- Patient has No headache, No chest pain, No abdominal pain - No Nausea, No new weakness tingling or numbness, No Cough - shortness of breath. T2D- Denies polyuria, polydipsia, polyphasia or vision changes.  Does not check blood sugars at home.   No problems updated.  Comprehensive ROS Pertinent positive and negative noted in HPI   Allergies  Allergen Reactions   Ace Inhibitors Swelling   Penicillin G Benzathine & Proc Rash   Penicillins Hives    Hives     Past Medical History:  Diagnosis Date   CAD (coronary artery disease)    a. NSTEMI and repetitive NSVT s/p overlapping DES x2 to [facility] on 07/11/14 and staged PCI w/ DES to LCx on 07/13/14   Diabetes mellitus type II, controlled (HCC)    Erectile dysfunction    Hyperlipemia    Hypertension    Marijuana abuse    Noncompliance    NSVT (nonsustained ventricular tachycardia) (HCC)    a. in the setting of ACS/NSTEMI 07/11/14;  b. 06/2014 Echo: EF 50-55%.   Obesity    Peyronie disease    Prostate CA (HCC)    a. 11/2013 s/p prostatectomy.   Tobacco abuse     Current Outpatient Medications on File Prior to Visit  Medication Sig Dispense Refill   aspirin EC 81 MG tablet Take 1 tablet (81 mg total) by mouth daily. 90 tablet 2   atorvastatin (LIPITOR) 80 MG tablet TAKE 1 TABLET BY MOUTH EVERY DAY AT 6PM IN THE EVENING 90 tablet 0   Continuous Glucose Receiver (DEXCOM G7 RECEIVER) DEVI Use to check glucose continuously. 1 each 0   Continuous Glucose Sensor (DEXCOM G7 SENSOR) MISC Place 1 sensor on the skin every 10 days. Use to check glucose continuously. 3 each 6   erythromycin ophthalmic ointment Place a 1/2 inch ribbon  of ointment into the lower eyelid. 3.5 g 0   EYSUVIS 0.25 % SUSP Apply to eye daily.     hydrocortisone cream 1 % Apply to affected area 2 times daily 14 g 0   Insulin Pen Needle (TRUEPLUS 5-BEVEL PEN NEEDLES) 32G X 4 MM MISC Inject 1 each into the skin daily. 100 each 11   nitroGLYCERIN (NITROSTAT) 0.4 MG SL tablet Place 1 tablet (0.4 mg total) under the tongue every 5 (five) minutes x 3 doses as needed for chest pain. 25 tablet 3   ofloxacin (OCUFLOX) 0.3 % ophthalmic solution Place 1 drop into the right eye 4 (four) times daily FOR 7 DAYS STARTING AFTER SURGERY 5 mL 1   sildenafil (REVATIO) 20 MG tablet Take 2-5 tablets (40-100 mg total) by mouth 1-2 hrs prior to sexual activity on an empty stomach. 30 tablet 3   No current facility-administered medications on file prior to visit.   Health Maintenance  Topic Date Due   COVID-19 Vaccine (1) Never done   Pneumococcal Vaccination (1 of 2 - PCV) Never done   Complete foot exam   Never done   Zoster (Shingles) Vaccine (1 of 2) Never done   Colon Cancer Screening  Never done   Screening for Lung Cancer  Never done   Flu  Shot  12/13/2023*   Eye exam for diabetics  01/05/2024   Hemoglobin A1C  03/15/2024   Yearly kidney function blood test for diabetes  09/15/2024   Yearly kidney health urinalysis for diabetes  09/15/2024   DTaP/Tdap/Td vaccine (3 - Td or Tdap) 11/25/2032   Hepatitis C Screening  Completed   HIV Screening  Completed   HPV Vaccine  Aged Out  *Topic was postponed. The date shown is not the original due date.    Objective:   Vitals:   11/11/23 0943  BP: 135/89  Pulse: 75  SpO2: 98%  Weight: 207 lb 6.4 oz (94.1 kg)   BP Readings from Last 3 Encounters:  11/11/23 135/89  09/04/23 (!) 160/113  05/28/23 132/80      Physical Exam Vitals reviewed.  Constitutional:      Appearance: He is obese.  HENT:     Head: Normocephalic.     Right Ear: Tympanic membrane normal.     Left Ear: Tympanic membrane normal.      Nose: Nose normal.  Eyes:     Extraocular Movements: Extraocular movements intact.  Cardiovascular:     Rate and Rhythm: Normal rate and regular rhythm.  Pulmonary:     Effort: Pulmonary effort is normal.     Breath sounds: Normal breath sounds.  Abdominal:     General: Bowel sounds are normal. There is distension.     Palpations: Abdomen is soft.  Musculoskeletal:        General: Normal range of motion.     Cervical back: Normal range of motion and neck supple.  Skin:    General: Skin is warm and dry.  Neurological:     Mental Status: He is alert and oriented to person, place, and time.  Psychiatric:        Mood and Affect: Mood normal.        Behavior: Behavior normal.     Assessment & Plan  Thomas Watts was seen today for medical management of chronic issues and medication refill.  Diagnoses and all orders for this visit:  Primary hypertension BP goal - < 140/90 Explained that having normal blood pressure is the goal and medications are helping to get to goal and maintain normal blood pressure. DIET: Limit salt intake, read nutrition labels to check salt content, limit fried and high fatty foods  Avoid using multisymptom OTC cold preparations that generally contain sudafed which can rise BP. Consult with pharmacist on best cold relief products to use for persons with HTN EXERCISE Discussed incorporating exercise such as walking - 30 minutes most days of the week and can do in 10 minute intervals    -     amLODipine (NORVASC) 10 MG tablet; Take 1 tablet (10 mg total) by mouth daily. -     carvedilol (COREG) 25 MG tablet; Take 1 tablet (25 mg total) by mouth 2 (two) times daily as directed.  Other orders -     insulin degludec (TRESIBA FLEXTOUCH) 100 UNIT/ML FlexTouch Pen; Inject 36 Units into the skin daily. -     Semaglutide, 2 MG/DOSE, 8 MG/3ML SOPN; Inject 2 mg as directed once a week.    Patient have been counseled extensively about nutrition and exercise. Other issues  discussed during this visit include: low cholesterol diet, weight control and daily exercise, foot care, annual eye examinations at Ophthalmology, importance of adherence with medications and regular follow-up. We also discussed long term complications of uncontrolled diabetes and hypertension.   Return  in about 2 months (around 01/09/2024).  The patient was given clear instructions to go to ER or return to medical center if symptoms don't improve, worsen or new problems develop. The patient verbalized understanding. The patient was told to call to get lab results if they haven't heard anything in the next week.   This note has been created with Education officer, environmental. Any transcriptional errors are unintentional.   [name], NP 11/14/2023, 9:13 PM

## 2023-11-17 ENCOUNTER — Other Ambulatory Visit: Payer: Self-pay

## 2023-11-24 ENCOUNTER — Other Ambulatory Visit (HOSPITAL_COMMUNITY): Payer: Self-pay

## 2023-12-02 ENCOUNTER — Other Ambulatory Visit: Payer: Self-pay

## 2023-12-06 ENCOUNTER — Other Ambulatory Visit: Payer: PRIVATE HEALTH INSURANCE

## 2023-12-08 ENCOUNTER — Other Ambulatory Visit (HOSPITAL_COMMUNITY): Payer: Self-pay

## 2023-12-08 ENCOUNTER — Encounter: Payer: Self-pay | Admitting: "Endocrinology

## 2023-12-08 ENCOUNTER — Ambulatory Visit (INDEPENDENT_AMBULATORY_CARE_PROVIDER_SITE_OTHER): Payer: PRIVATE HEALTH INSURANCE | Admitting: "Endocrinology

## 2023-12-08 VITALS — BP 100/80 | HR 65 | Ht 71.0 in | Wt 205.6 lb

## 2023-12-08 DIAGNOSIS — E1165 Type 2 diabetes mellitus with hyperglycemia: Secondary | ICD-10-CM

## 2023-12-08 DIAGNOSIS — Z794 Long term (current) use of insulin: Secondary | ICD-10-CM | POA: Diagnosis not present

## 2023-12-08 DIAGNOSIS — Z7985 Long-term (current) use of injectable non-insulin antidiabetic drugs: Secondary | ICD-10-CM

## 2023-12-08 DIAGNOSIS — E7800 Pure hypercholesterolemia, unspecified: Secondary | ICD-10-CM | POA: Diagnosis not present

## 2023-12-08 MED ORDER — BAQSIMI ONE PACK 3 MG/DOSE NA POWD
1.0000 | NASAL | 3 refills | Status: AC | PRN
  Filled 2023-12-08: qty 2, 2d supply, fill #0
  Filled 2024-03-05: qty 2, 30d supply, fill #0
  Filled 2024-03-28: qty 2, 1d supply, fill #0

## 2023-12-08 NOTE — Patient Instructions (Signed)
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
## 2023-12-09 ENCOUNTER — Encounter: Payer: Self-pay | Admitting: Pharmacist

## 2023-12-09 ENCOUNTER — Other Ambulatory Visit: Payer: Self-pay

## 2023-12-14 ENCOUNTER — Other Ambulatory Visit: Payer: Self-pay

## 2023-12-14 ENCOUNTER — Ambulatory Visit: Payer: PRIVATE HEALTH INSURANCE | Admitting: Urology

## 2023-12-15 ENCOUNTER — Ambulatory Visit (INDEPENDENT_AMBULATORY_CARE_PROVIDER_SITE_OTHER): Payer: PRIVATE HEALTH INSURANCE | Admitting: Urology

## 2023-12-15 VITALS — BP 157/100 | HR 85 | Ht 71.0 in | Wt 207.0 lb

## 2023-12-15 DIAGNOSIS — Z8546 Personal history of malignant neoplasm of prostate: Secondary | ICD-10-CM | POA: Diagnosis not present

## 2023-12-15 DIAGNOSIS — N529 Male erectile dysfunction, unspecified: Secondary | ICD-10-CM

## 2023-12-15 DIAGNOSIS — N486 Induration penis plastica: Secondary | ICD-10-CM

## 2023-12-15 LAB — URINALYSIS, ROUTINE W REFLEX MICROSCOPIC
Bilirubin, UA: NEGATIVE
Ketones, UA: NEGATIVE
Leukocytes,UA: NEGATIVE
Nitrite, UA: NEGATIVE
Protein,UA: NEGATIVE
RBC, UA: NEGATIVE
Specific Gravity, UA: 1.015 (ref 1.005–1.030)
Urobilinogen, Ur: 0.2 mg/dL (ref 0.2–1.0)
pH, UA: 6 (ref 5.0–7.5)

## 2023-12-15 LAB — MICROSCOPIC EXAMINATION: Bacteria, UA: NONE SEEN

## 2023-12-15 MED ORDER — SILDENAFIL CITRATE 20 MG PO TABS: 40.0000 mg | ORAL_TABLET | ORAL | 5 refills | Status: AC | PRN
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
## 2023-12-16 ENCOUNTER — Encounter: Payer: Self-pay | Admitting: Urology

## 2023-12-16 LAB — PSA: Prostate Specific Ag, Serum: <0.1 ng/mL (ref 0.0–4.0)

## 2024-01-01 ENCOUNTER — Other Ambulatory Visit (HOSPITAL_COMMUNITY): Payer: Self-pay

## 2024-01-03 ENCOUNTER — Telehealth (INDEPENDENT_AMBULATORY_CARE_PROVIDER_SITE_OTHER): Payer: Self-pay | Admitting: Primary Care

## 2024-01-03 NOTE — Telephone Encounter (Signed)
 Called pt to confirm appt.Pt did not answer and could not LVM.

## 2024-01-10 ENCOUNTER — Encounter (INDEPENDENT_AMBULATORY_CARE_PROVIDER_SITE_OTHER): Payer: Self-pay | Admitting: Primary Care

## 2024-01-10 ENCOUNTER — Ambulatory Visit (INDEPENDENT_AMBULATORY_CARE_PROVIDER_SITE_OTHER): Payer: PRIVATE HEALTH INSURANCE | Admitting: Primary Care

## 2024-01-10 VITALS — BP 150/88 | HR 88 | Resp 16 | Wt 205.4 lb

## 2024-01-10 DIAGNOSIS — Z1211 Encounter for screening for malignant neoplasm of colon: Secondary | ICD-10-CM

## 2024-01-10 DIAGNOSIS — Z760 Encounter for issue of repeat prescription: Secondary | ICD-10-CM

## 2024-01-10 DIAGNOSIS — Z720 Tobacco use: Secondary | ICD-10-CM | POA: Diagnosis not present

## 2024-01-10 DIAGNOSIS — I10 Essential (primary) hypertension: Secondary | ICD-10-CM | POA: Diagnosis not present

## 2024-01-10 MED ORDER — AMLODIPINE BESYLATE 10 MG PO TABS
10.0000 mg | ORAL_TABLET | Freq: Every day | ORAL | 1 refills | Status: AC
Start: 2024-01-10 — End: ?

## 2024-01-10 MED ORDER — CARVEDILOL 25 MG PO TABS: 25.0000 mg | ORAL_TABLET | Freq: Two times a day (BID) | ORAL | 1 refills | Status: DC

## 2024-01-10 MED ORDER — TRESIBA FLEXTOUCH 100 UNIT/ML ~~LOC~~ SOPN: 36.0000 [IU] | PEN_INJECTOR | Freq: Every day | SUBCUTANEOUS | 1 refills | Status: DC

## 2024-01-10 MED ORDER — SEMAGLUTIDE (2 MG/DOSE) 8 MG/3ML ~~LOC~~ SOPN: 2.0000 mg | PEN_INJECTOR | SUBCUTANEOUS | 1 refills | Status: AC
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
## 2024-02-18 ENCOUNTER — Encounter: Payer: Self-pay | Admitting: Cardiology

## 2024-03-05 ENCOUNTER — Other Ambulatory Visit (HOSPITAL_COMMUNITY): Payer: Self-pay

## 2024-03-05 ENCOUNTER — Other Ambulatory Visit (INDEPENDENT_AMBULATORY_CARE_PROVIDER_SITE_OTHER): Payer: Self-pay | Admitting: Primary Care

## 2024-03-05 ENCOUNTER — Other Ambulatory Visit: Payer: Self-pay | Admitting: Family Medicine

## 2024-03-05 ENCOUNTER — Other Ambulatory Visit: Payer: Self-pay | Admitting: Physician Assistant

## 2024-03-05 DIAGNOSIS — E1139 Type 2 diabetes mellitus with other diabetic ophthalmic complication: Secondary | ICD-10-CM

## 2024-03-05 DIAGNOSIS — I252 Old myocardial infarction: Secondary | ICD-10-CM

## 2024-03-05 DIAGNOSIS — Z760 Encounter for issue of repeat prescription: Secondary | ICD-10-CM

## 2024-03-06 ENCOUNTER — Other Ambulatory Visit (HOSPITAL_COMMUNITY): Payer: Self-pay

## 2024-03-06 ENCOUNTER — Other Ambulatory Visit: Payer: Self-pay

## 2024-03-06 MED ORDER — DEXCOM G7 RECEIVER DEVI
0 refills | Status: AC
  Filled 2024-03-06: qty 1, fill #0
  Filled 2024-03-07: qty 1, 30d supply, fill #0

## 2024-03-06 NOTE — Telephone Encounter (Signed)
 Requested Prescriptions  Pending Prescriptions Disp Refills   insulin  degludec (TRESIBA  FLEXTOUCH) 100 UNIT/ML FlexTouch Pen [Pharmacy Med Name: TRESIBA  FLEXTOUCH 100 UNIT/ML] 9 mL 1    Sig: INJECT 36 UNITS INTO THE SKIN DAILY.     Endocrinology:  Diabetes - Insulins Passed - 03/06/2024  3:37 PM      Passed - HBA1C is between 0 and 7.9 and within 180 days    Hemoglobin A1C  Date Value Ref Range Status  12/28/2022 8.6  Final   HbA1c, POC (controlled diabetic range)  Date Value Ref Range Status  05/06/2023 10.2 (A) 0.0 - 7.0 % Final   Hgb A1c MFr Bld  Date Value Ref Range Status  09/16/2023 7.7 (H) <5.7 % of total Hgb Final    Comment:    For someone without known diabetes, a hemoglobin A1c value of 6.5% or greater indicates that they may have  diabetes and this should be confirmed with a follow-up  test. . For someone with known diabetes, a value <7% indicates  that their diabetes is well controlled and a value  greater than or equal to 7% indicates suboptimal  control. A1c targets should be individualized based on  duration of diabetes, age, comorbid conditions, and  other considerations. . Currently, no consensus exists regarding use of hemoglobin A1c for diagnosis of diabetes for children. SABRA Amy - Valid encounter within last 6 months    Recent Outpatient Visits           1 month ago Medication refill   [place] [facility] Celestia Rosaline SQUIBB, NP   3 months ago Primary hypertension   [place] [facility] Celestia Rosaline SQUIBB, NP   9 months ago Type 2 diabetes mellitus with other ophthalmic complication, with long-term current use of insulin  [facility])   [facility] [place] Fleeta Morris, [place] L, RPH-CPP   10 months ago Type 2 diabetes mellitus with other ophthalmic complication, with long-term current use of insulin  [facility])   [place] [facility]  Celestia Rosaline SQUIBB, NP   11 months ago Secondary hypertension   [place] [facility] Celestia Rosaline SQUIBB, NP

## 2024-03-07 ENCOUNTER — Other Ambulatory Visit (HOSPITAL_COMMUNITY): Payer: Self-pay

## 2024-03-07 ENCOUNTER — Other Ambulatory Visit: Payer: Self-pay

## 2024-03-07 ENCOUNTER — Encounter: Payer: Self-pay | Admitting: Pharmacist

## 2024-03-08 ENCOUNTER — Other Ambulatory Visit: Payer: Self-pay

## 2024-03-08 ENCOUNTER — Other Ambulatory Visit (HOSPITAL_COMMUNITY): Payer: Self-pay

## 2024-03-09 ENCOUNTER — Ambulatory Visit (INDEPENDENT_AMBULATORY_CARE_PROVIDER_SITE_OTHER): Payer: PRIVATE HEALTH INSURANCE | Admitting: "Endocrinology

## 2024-03-09 ENCOUNTER — Encounter: Payer: Self-pay | Admitting: "Endocrinology

## 2024-03-09 ENCOUNTER — Other Ambulatory Visit (HOSPITAL_COMMUNITY): Payer: Self-pay

## 2024-03-09 VITALS — BP 120/80 | HR 85 | Ht 71.0 in | Wt 208.0 lb

## 2024-03-09 DIAGNOSIS — E1140 Type 2 diabetes mellitus with diabetic neuropathy, unspecified: Secondary | ICD-10-CM

## 2024-03-09 DIAGNOSIS — E7800 Pure hypercholesterolemia, unspecified: Secondary | ICD-10-CM | POA: Diagnosis not present

## 2024-03-09 DIAGNOSIS — Z794 Long term (current) use of insulin: Secondary | ICD-10-CM | POA: Diagnosis not present

## 2024-03-09 DIAGNOSIS — E1165 Type 2 diabetes mellitus with hyperglycemia: Secondary | ICD-10-CM

## 2024-03-09 DIAGNOSIS — Z7985 Long-term (current) use of injectable non-insulin antidiabetic drugs: Secondary | ICD-10-CM

## 2024-03-09 LAB — POCT GLYCOSYLATED HEMOGLOBIN (HGB A1C): Hemoglobin A1C: 7.3 % — AB (ref 4.0–5.6)

## 2024-03-09 MED ORDER — ASPIRIN 81 MG PO TBEC
81.0000 mg | DELAYED_RELEASE_TABLET | Freq: Every day | ORAL | 2 refills | Status: DC
  Filled 2024-03-09: qty 90, 90d supply, fill #0
  Filled 2024-03-11: qty 90, 90d supply, fill #1
  Filled 2024-04-24 – 2024-09-05 (×2): qty 90, 90d supply, fill #2
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
## 2024-03-09 NOTE — Patient Instructions (Signed)

## 2024-03-10 ENCOUNTER — Other Ambulatory Visit: Payer: Self-pay

## 2024-03-11 ENCOUNTER — Other Ambulatory Visit (HOSPITAL_COMMUNITY): Payer: Self-pay

## 2024-03-13 ENCOUNTER — Other Ambulatory Visit (HOSPITAL_COMMUNITY): Payer: Self-pay

## 2024-03-21 ENCOUNTER — Other Ambulatory Visit: Payer: Self-pay | Admitting: Physician Assistant

## 2024-03-21 DIAGNOSIS — E1139 Type 2 diabetes mellitus with other diabetic ophthalmic complication: Secondary | ICD-10-CM

## 2024-03-23 ENCOUNTER — Other Ambulatory Visit: Payer: Self-pay | Admitting: Primary Care

## 2024-03-23 DIAGNOSIS — E782 Mixed hyperlipidemia: Secondary | ICD-10-CM

## 2024-03-24 NOTE — Telephone Encounter (Signed)
 Requested Prescriptions  Pending Prescriptions Disp Refills   atorvastatin  (LIPITOR ) 80 MG tablet [Pharmacy Med Name: ATORVASTATIN  80 MG TABLET] 90 tablet 1    Sig: TAKE 1 TABLET BY MOUTH EVERY DAY AT 6PM IN THE EVENING     Cardiovascular:  Antilipid - Statins Failed - 03/24/2024  2:20 PM      Failed - Lipid Panel in normal range within the last 12 months    Cholesterol, Total  Date Value Ref Range Status  05/06/2023 194 100 - 199 mg/dL Final   Cholesterol  Date Value Ref Range Status  09/16/2023 271 (H) <200 mg/dL Final   LDL Cholesterol (Calc)  Date Value Ref Range Status  09/16/2023 205 (H) mg/dL (calc) Final    Comment:    LDL-C levels > or = 190 mg/dL may indicate familial  hypercholesterolemia (FH). Clinical assessment and  measurement of blood lipid levels should be  considered for all first degree relatives of patients with an FH diagnosis. LDL Cholesterol (LDL-C) levels > or = 300 mg/dL may indicate homozygous familial hypercholesterolemia (HoFH). Untreated,  these extremely high LDL-C levels can result in premature CV events and mortality. Patients should be identified early and provided appropriate interventions to reduce the cumulative LDL-C burden from birth. . For questions about testing for familial hypercholesterolemia, please call Engineer, materials at [email or web address].GENE.INFO. SABRA Veatrice DASEN, et al. J National Lipid Association  Recommendations for Patient-Centered Management of Dyslipidemia: Part 1 Journal of  Clinical Lipidology 2015;9(2), 129-169. Cuchel, M. et al. (2014). Homozygous familial hypercholesterolaemia: new insights and guidance for clinicians to improve detection and clinical management. European Heart Journal, 35(32), [phone]. Reference range: <100 . Desirable range <100 mg/dL for primary prevention;   <70 mg/dL for patients with CHD or diabetic patients  with > or = 2 CHD risk factors. SABRA LDL-C is now calculated using the  Martin-Hopkins  calculation, which is a validated novel method providing  better accuracy than the Friedewald equation in the  estimation of LDL-C.  Gladis APPLETHWAITE et al. SANDREA. 7986;689(80): 2061-2068  (http://education.Quest[facility].com/faq/FAQ164)    HDL  Date Value Ref Range Status  09/16/2023 41 > OR = 40 mg/dL Final  91/77/7975 42 >60 mg/dL Final   Triglycerides  Date Value Ref Range Status  09/16/2023 111 <150 mg/dL Final         Passed - Patient is not pregnant      Passed - Valid encounter within last 12 months    Recent Outpatient Visits           2 months ago Medication refill   [place] [facility] Celestia Rosaline SQUIBB, NP   4 months ago Primary hypertension   [place] [facility] Celestia Rosaline SQUIBB, NP   9 months ago Type 2 diabetes mellitus with other ophthalmic complication, with long-term current use of insulin  [facility])   [facility] [place] Fleeta Tonia Senior L, RPH-CPP   10 months ago Type 2 diabetes mellitus with other ophthalmic complication, with long-term current use of insulin  [facility])   [place] [facility] Celestia Rosaline SQUIBB, NP   1 year ago Secondary hypertension   [place] [facility] Celestia Rosaline SQUIBB, NP

## 2024-03-28 ENCOUNTER — Other Ambulatory Visit: Payer: Self-pay

## 2024-03-28 ENCOUNTER — Other Ambulatory Visit (HOSPITAL_COMMUNITY): Payer: Self-pay

## 2024-04-07 ENCOUNTER — Other Ambulatory Visit: Payer: Self-pay

## 2024-04-08 ENCOUNTER — Other Ambulatory Visit: Payer: Self-pay | Admitting: Physician Assistant

## 2024-04-08 DIAGNOSIS — E1139 Type 2 diabetes mellitus with other diabetic ophthalmic complication: Secondary | ICD-10-CM

## 2024-04-10 ENCOUNTER — Other Ambulatory Visit (HOSPITAL_COMMUNITY): Payer: Self-pay

## 2024-04-12 ENCOUNTER — Other Ambulatory Visit (INDEPENDENT_AMBULATORY_CARE_PROVIDER_SITE_OTHER): Payer: Self-pay | Admitting: Primary Care

## 2024-04-12 DIAGNOSIS — Z760 Encounter for issue of repeat prescription: Secondary | ICD-10-CM

## 2024-04-12 NOTE — Telephone Encounter (Unsigned)
 Copied from CRM [id number]. Topic: Clinical - Medication Refill >> Apr 12, 2024  5:24 PM Delon T wrote: Medication: insulin  degludec (TRESIBA  FLEXTOUCH) 100 UNIT/ML FlexTouch Pen  Has the patient contacted their pharmacy? Yes (Agent: If no, request that the patient contact the pharmacy for the refill. If patient does not wish to contact the pharmacy document the reason why and proceed with request.) (Agent: If yes, when and what did the pharmacy advise?)  This is the patient's preferred pharmacy:  [facility] [phone] [address], [place] - [address] [place] [address] Phone: [phone] Fax: [phone]  Is this the correct pharmacy for this prescription? Yes If no, delete pharmacy and type the correct one.   Has the prescription been filled recently? Yes  Is the patient out of the medication? Yes  Has the patient been seen for an appointment in the last year OR does the patient have an upcoming appointment? Yes  Can we respond through MyChart? Yes  Agent: Please be advised that Rx refills may take up to 3 business days. We ask that you follow-up with your pharmacy.

## 2024-04-13 MED ORDER — TRESIBA FLEXTOUCH 100 UNIT/ML ~~LOC~~ SOPN: 36.0000 [IU] | PEN_INJECTOR | Freq: Every day | SUBCUTANEOUS | 1 refills | Status: AC

## 2024-04-13 NOTE — Telephone Encounter (Signed)
 Requested Prescriptions  Pending Prescriptions Disp Refills   insulin  degludec (TRESIBA  FLEXTOUCH) 100 UNIT/ML FlexTouch Pen 9 mL 1    Sig: Inject 36 Units into the skin daily.     Endocrinology:  Diabetes - Insulins Passed - 04/13/2024  2:26 PM      Passed - HBA1C is between 0 and 7.9 and within 180 days    Hemoglobin A1C  Date Value Ref Range Status  03/09/2024 7.3 (A) 4.0 - 5.6 % Final  12/28/2022 8.6  Final   HbA1c, POC (controlled diabetic range)  Date Value Ref Range Status  05/06/2023 10.2 (A) 0.0 - 7.0 % Final   Hgb A1c MFr Bld  Date Value Ref Range Status  09/16/2023 7.7 (H) <5.7 % of total Hgb Final    Comment:    For someone without known diabetes, a hemoglobin A1c value of 6.5% or greater indicates that they may have  diabetes and this should be confirmed with a follow-up  test. . For someone with known diabetes, a value <7% indicates  that their diabetes is well controlled and a value  greater than or equal to 7% indicates suboptimal  control. A1c targets should be individualized based on  duration of diabetes, age, comorbid conditions, and  other considerations. . Currently, no consensus exists regarding use of hemoglobin A1c for diagnosis of diabetes for children. SABRA Amy - Valid encounter within last 6 months    Recent Outpatient Visits           3 months ago Medication refill   [place] [facility] Celestia Rosaline SQUIBB, NP   5 months ago Primary hypertension   [place] [facility] Celestia Rosaline SQUIBB, NP   10 months ago Type 2 diabetes mellitus with other ophthalmic complication, with long-term current use of insulin  [facility])   [facility] [place] [facility] Fleeta Morris, Jackson L, RPH-CPP   11 months ago Type 2 diabetes mellitus with other ophthalmic complication, with long-term current use of insulin  [facility])   [place] [facility] Celestia Rosaline SQUIBB, NP   1 year ago Secondary hypertension   [place] [facility] Celestia Rosaline SQUIBB, NP

## 2024-04-24 ENCOUNTER — Other Ambulatory Visit (HOSPITAL_COMMUNITY): Payer: Self-pay

## 2024-05-09 ENCOUNTER — Other Ambulatory Visit (HOSPITAL_COMMUNITY): Payer: Self-pay

## 2024-05-23 ENCOUNTER — Other Ambulatory Visit (HOSPITAL_COMMUNITY): Payer: Self-pay

## 2024-06-19 ENCOUNTER — Other Ambulatory Visit: Payer: Self-pay

## 2024-06-21 ENCOUNTER — Encounter (HOSPITAL_COMMUNITY): Payer: Self-pay

## 2024-06-21 ENCOUNTER — Other Ambulatory Visit: Payer: Self-pay

## 2024-07-17 ENCOUNTER — Ambulatory Visit (INDEPENDENT_AMBULATORY_CARE_PROVIDER_SITE_OTHER): Payer: PRIVATE HEALTH INSURANCE | Admitting: "Endocrinology

## 2024-07-17 ENCOUNTER — Encounter: Payer: Self-pay | Admitting: "Endocrinology

## 2024-07-17 VITALS — BP 138/80 | HR 86 | Ht 71.0 in | Wt 212.0 lb

## 2024-07-17 DIAGNOSIS — Z794 Long term (current) use of insulin: Secondary | ICD-10-CM

## 2024-07-17 DIAGNOSIS — E7800 Pure hypercholesterolemia, unspecified: Secondary | ICD-10-CM

## 2024-07-17 DIAGNOSIS — E1140 Type 2 diabetes mellitus with diabetic neuropathy, unspecified: Secondary | ICD-10-CM | POA: Diagnosis not present

## 2024-07-17 DIAGNOSIS — Z7985 Long-term (current) use of injectable non-insulin antidiabetic drugs: Secondary | ICD-10-CM

## 2024-07-17 LAB — POCT GLYCOSYLATED HEMOGLOBIN (HGB A1C): Hemoglobin A1C: 8.8 % — AB (ref 4.0–5.6)

## 2024-07-17 MED ORDER — LANCETS MISC: 1.0000 | 0 refills | Status: DC

## 2024-07-17 MED ORDER — DEXCOM G7 SENSOR MISC: 1.0000 | 0 refills | Status: DC

## 2024-07-17 MED ORDER — BLOOD GLUCOSE TEST VI STRP
1.0000 | ORAL_STRIP | Freq: Three times a day (TID) | 3 refills | Status: AC
Start: 2024-07-17 — End: 2024-11-27

## 2024-07-17 MED ORDER — LANCET DEVICE MISC: 1.0000 | Freq: Three times a day (TID) | 0 refills | Status: AC

## 2024-07-17 MED ORDER — BLOOD GLUCOSE MONITORING SUPPL DEVI: 1.0000 | Freq: Three times a day (TID) | 0 refills | Status: DC
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
## 2024-07-17 NOTE — Patient Instructions (Signed)

## 2024-07-18 ENCOUNTER — Encounter (INDEPENDENT_AMBULATORY_CARE_PROVIDER_SITE_OTHER): Payer: Self-pay | Admitting: Primary Care

## 2024-07-20 ENCOUNTER — Other Ambulatory Visit: Payer: Self-pay

## 2024-07-20 ENCOUNTER — Emergency Department (HOSPITAL_COMMUNITY)
Admission: EM | Admit: 2024-07-20 | Discharge: 2024-07-20 | Disposition: A | Discharge status: Home / Self Care | Attending: Emergency Medicine | Admitting: Emergency Medicine

## 2024-07-20 ENCOUNTER — Emergency Department (HOSPITAL_COMMUNITY)

## 2024-07-20 DIAGNOSIS — E1165 Type 2 diabetes mellitus with hyperglycemia: Secondary | ICD-10-CM | POA: Insufficient documentation

## 2024-07-20 DIAGNOSIS — E871 Hypo-osmolality and hyponatremia: Secondary | ICD-10-CM | POA: Diagnosis not present

## 2024-07-20 DIAGNOSIS — B349 Viral infection, unspecified: Secondary | ICD-10-CM | POA: Insufficient documentation

## 2024-07-20 DIAGNOSIS — Z7984 Long term (current) use of oral hypoglycemic drugs: Secondary | ICD-10-CM | POA: Insufficient documentation

## 2024-07-20 DIAGNOSIS — Z79899 Other long term (current) drug therapy: Secondary | ICD-10-CM | POA: Insufficient documentation

## 2024-07-20 DIAGNOSIS — I10 Essential (primary) hypertension: Secondary | ICD-10-CM | POA: Diagnosis not present

## 2024-07-20 DIAGNOSIS — Z7982 Long term (current) use of aspirin: Secondary | ICD-10-CM | POA: Insufficient documentation

## 2024-07-20 DIAGNOSIS — R112 Nausea with vomiting, unspecified: Secondary | ICD-10-CM | POA: Diagnosis not present

## 2024-07-20 DIAGNOSIS — Z794 Long term (current) use of insulin: Secondary | ICD-10-CM | POA: Insufficient documentation

## 2024-07-20 DIAGNOSIS — R059 Cough, unspecified: Secondary | ICD-10-CM | POA: Diagnosis present

## 2024-07-20 DIAGNOSIS — R739 Hyperglycemia, unspecified: Secondary | ICD-10-CM

## 2024-07-20 LAB — CBC WITH DIFFERENTIAL/PLATELET
Abs Immature Granulocytes: 0.01 K/uL (ref 0.00–0.07)
Basophils Absolute: 0 K/uL (ref 0.0–0.1)
Basophils Relative: 1 %
Eosinophils Absolute: 0.2 K/uL (ref 0.0–0.5)
Eosinophils Relative: 2 %
HCT: 48.8 % (ref 39.0–52.0)
Hemoglobin: 16.8 g/dL (ref 13.0–17.0)
Immature Granulocytes: 0 %
Lymphocytes Relative: 39 %
Lymphs Abs: 2.6 K/uL (ref 0.7–4.0)
MCH: 29.3 pg (ref 26.0–34.0)
MCHC: 34.4 g/dL (ref 30.0–36.0)
MCV: 85 fL (ref 80.0–100.0)
Monocytes Absolute: 0.5 K/uL (ref 0.1–1.0)
Monocytes Relative: 7 %
Neutro Abs: 3.5 K/uL (ref 1.7–7.7)
Neutrophils Relative %: 51 %
Platelets: 295 K/uL (ref 150–400)
RBC: 5.74 MIL/uL (ref 4.22–5.81)
RDW: 13.5 % (ref 11.5–15.5)
WBC: 6.7 K/uL (ref 4.0–10.5)
nRBC: 0 % (ref 0.0–0.2)

## 2024-07-20 LAB — COMPREHENSIVE METABOLIC PANEL WITH GFR
ALT: 20 U/L (ref 0–44)
AST: 17 U/L (ref 15–41)
Albumin: 3.8 g/dL (ref 3.5–5.0)
Alkaline Phosphatase: 78 U/L (ref 38–126)
Anion gap: 12 (ref 5–15)
BUN: 11 mg/dL (ref 8–23)
CO2: 20 mmol/L — ABNORMAL LOW (ref 22–32)
Calcium: 9.4 mg/dL (ref 8.9–10.3)
Chloride: 100 mmol/L (ref 98–111)
Creatinine, Ser: 0.93 mg/dL (ref 0.61–1.24)
GFR, Estimated: >60 mL/min (ref >60)
Glucose, Bld: 252 mg/dL — ABNORMAL HIGH (ref 70–99)
Potassium: 3.9 mmol/L (ref 3.5–5.1)
Sodium: 132 mmol/L — ABNORMAL LOW (ref 135–145)
Total Bilirubin: 1.3 mg/dL — ABNORMAL HIGH (ref 0.0–1.2)
Total Protein: 8.4 g/dL — ABNORMAL HIGH (ref 6.5–8.1)

## 2024-07-20 LAB — RESP PANEL BY RT-PCR (RSV, FLU A&B, COVID)  RVPGX2
Influenza A by PCR: NEGATIVE
Influenza B by PCR: NEGATIVE
Resp Syncytial Virus by PCR: NEGATIVE
SARS Coronavirus 2 by RT PCR: NEGATIVE

## 2024-07-20 LAB — LIPASE, BLOOD: Lipase: 29 U/L (ref 11–51)

## 2024-07-20 MED ORDER — ONDANSETRON 4 MG PO TBDP: 4.0000 mg | ORAL_TABLET | Freq: Four times a day (QID) | ORAL | 0 refills | Status: AC | PRN

## 2024-07-20 MED ORDER — INSULIN ASPART 100 UNIT/ML IJ SOLN
5.0000 [IU] | Freq: Once | INTRAMUSCULAR | Status: AC
  Administered 2024-07-20: 5 [IU] via SUBCUTANEOUS
  Filled 2024-07-20: qty 5

## 2024-07-20 MED ORDER — ONDANSETRON 4 MG PO TBDP
4.0000 mg | ORAL_TABLET | Freq: Once | ORAL | Status: AC
  Administered 2024-07-20: 4 mg via ORAL
  Filled 2024-07-20: qty 1

## 2024-07-20 MED ORDER — ACETAMINOPHEN 325 MG PO TABS
650.0000 mg | ORAL_TABLET | Freq: Once | ORAL | Status: AC
  Administered 2024-07-20: 650 mg via ORAL
  Filled 2024-07-20: qty 2

## 2024-07-20 NOTE — ED Provider Notes (Signed)
 [place] EMERGENCY DEPARTMENT AT [facility] Provider Note   CSN: [id number] Arrival date & time: 07/20/24  9261     Patient presents with: Emesis, Nausea, and Chills   Thomas Watts is a 63 y.o. male.   Thomas Watts is a 63 y.o. male with a history of hypertension, hyperlipidemia and diabetes, who presents to the emergency department for evaluation of chills, nausea vomiting and cough.  Started feeling sick yesterday and last night vomited multiple times.  Denies any associated abdominal pain or fevers and he reports that overall today he is feeling much better but was concerned and wanted to know what was going on.  He reports that he has had multiple family members sick with similar symptoms.  Symptoms seem to come on suddenly with some associated congestion and bodyaches.  He also notes that his blood sugars have been been running a bit high in the 200s  The history is provided by the patient and medical records.  Emesis Associated symptoms: chills, cough and myalgias   Associated symptoms: no abdominal pain, no diarrhea, no fever and no headaches        Prior to Admission medications   Medication Sig Start Date End Date Taking? Authorizing Provider  amLODipine  (NORVASC ) 10 MG tablet Take 1 tablet (10 mg total) by mouth daily. 01/10/24   Celestia Rosaline SQUIBB, NP  aspirin  EC 81 MG tablet Take 1 tablet (81 mg total) by mouth daily. 03/09/24   Celestia Rosaline SQUIBB, NP  atorvastatin  (LIPITOR ) 80 MG tablet TAKE 1 TABLET BY MOUTH EVERY DAY AT 6PM IN THE EVENING 03/24/24   Celestia Rosaline SQUIBB, NP  BD PEN NEEDLE NANO 2ND GEN 32G X 4 MM MISC INJECT 1 INTO SKIN EVERY DAY 03/30/24   Celestia Rosaline SQUIBB, NP  Blood Glucose Monitoring Suppl DEVI 1 each by Does not apply route in the morning, at noon, and at bedtime. May substitute to any manufacturer covered by patient's insurance. 07/17/24   Motwani, Komal, MD  carvedilol  (COREG ) 25 MG tablet Take 1 tablet (25 mg total) by mouth 2  (two) times daily as directed. 01/10/24   Celestia Rosaline SQUIBB, NP  Continuous Glucose Receiver (DEXCOM G7 RECEIVER) DEVI Use to check glucose continuously. 03/06/24   Celestia Rosaline SQUIBB, NP  Continuous Glucose Sensor (DEXCOM G7 SENSOR) MISC Place 1 sensor on the skin every 10 days. Use to check glucose continuously. 06/11/23   Fleeta Tonia Garnette LITTIE, RPH-CPP  Continuous Glucose Sensor (DEXCOM G7 SENSOR) MISC 1 Device by Does not apply route continuous. 07/17/24   Motwani, Komal, MD  erythromycin  ophthalmic ointment Place a 1/2 inch ribbon of ointment into the lower eyelid. 05/07/23   Oliva Fallow, MD  EYSUVIS 0.25 % SUSP Apply to eye daily. 01/10/23   [provider]  Glucagon  (BAQSIMI  ONE PACK) 3 MG/DOSE POWD Place 1 Device into the nose as needed (Low blood sugar with impaired consciousness). 12/08/23   Motwani, Komal, MD  Glucose Blood (BLOOD GLUCOSE TEST STRIPS) STRP 1 each by In Vitro route in the morning, at noon, and at bedtime. May substitute to any manufacturer covered by patient's insurance. 07/17/24 11/27/24  Motwani, Komal, MD  hydrocortisone  cream 1 % Apply to affected area 2 times daily 09/25/22   Wedderburn, Ngozi N, NP  insulin  degludec (TRESIBA  FLEXTOUCH) 100 UNIT/ML FlexTouch Pen Inject 36 Units into the skin daily. 04/13/24   Celestia Rosaline SQUIBB, NP  Lancet Device MISC 1 each by Does not apply route in the morning,  at noon, and at bedtime. May substitute to any manufacturer covered by patient's insurance. 07/17/24 08/16/24  Motwani, Komal, MD  Lancets MISC 1 each by Does not apply route as directed. Dispense based on patient and insurance preference. Use up to four times daily as directed. (FOR ICD-10 E10.9, E11.9). 07/17/24   Motwani, Komal, MD  nitroGLYCERIN  (NITROSTAT ) 0.4 MG SL tablet Place 1 tablet (0.4 mg total) under the tongue every 5 (five) minutes x 3 doses as needed for chest pain. 10/22/22   Mayers, Cari S, PA-C  ofloxacin  (OCUFLOX ) 0.3 % ophthalmic solution Place 1 drop  into the right eye 4 (four) times daily FOR 7 DAYS STARTING AFTER SURGERY 06/26/23     Semaglutide , 2 MG/DOSE, 8 MG/3ML SOPN Inject 2 mg as directed once a week. 01/10/24   Celestia Rosaline SQUIBB, NP  sildenafil  (REVATIO ) 20 MG tablet Take 2-5 tablets (40-100 mg total) by mouth 1-2 hrs prior to sexual activity on an empty stomach. 12/15/23   Shona Layman BROCKS, MD    Allergies: Ace inhibitors, Penicillin g benzathine & proc, and Penicillins    Review of Systems  Constitutional:  Positive for chills. Negative for fever.  HENT:  Positive for congestion.   Respiratory:  Positive for cough.   Gastrointestinal:  Positive for nausea and vomiting. Negative for abdominal pain and diarrhea.  Musculoskeletal:  Positive for myalgias. Negative for neck pain and neck stiffness.  Neurological:  Negative for headaches.  All other systems reviewed and are negative.   Updated Vital Signs BP (!) 145/95   Pulse 95   Temp 98.3 F (36.8 C) (Oral)   Resp 16   SpO2 95%   Physical Exam Vitals and nursing note reviewed.  Constitutional:      General: He is not in acute distress.    Appearance: He is well-developed. He is not ill-appearing or diaphoretic.  HENT:     Head: Normocephalic and atraumatic.     Nose: No rhinorrhea.     Mouth/Throat:     Mouth: Mucous membranes are moist.     Pharynx: Oropharynx is clear.  Eyes:     General:        Right eye: No discharge.        Left eye: No discharge.  Neck:     Comments: No rigidity Cardiovascular:     Rate and Rhythm: Normal rate and regular rhythm.     Heart sounds: Normal heart sounds. No murmur heard.    No friction rub. No gallop.  Pulmonary:     Effort: Pulmonary effort is normal. No respiratory distress.     Breath sounds: Normal breath sounds.     Comments: Respirations equal and unlabored, patient able to speak in full sentences, lungs clear to auscultation bilaterally  Abdominal:     General: Bowel sounds are normal. There is no distension.      Palpations: Abdomen is soft. There is no mass.     Tenderness: There is no abdominal tenderness. There is no guarding.     Comments: Abdomen soft, nondistended, nontender to palpation in all quadrants without guarding or peritoneal signs  Musculoskeletal:        General: No deformity.     Cervical back: Neck supple.  Lymphadenopathy:     Cervical: No cervical adenopathy.  Skin:    General: Skin is warm and dry.     Capillary Refill: Capillary refill takes less than 2 seconds.  Neurological:     Mental Status: He is  alert and oriented to person, place, and time.  Psychiatric:        Mood and Affect: Mood normal.        Behavior: Behavior normal.     (all labs ordered are listed, but only abnormal results are displayed) Labs Reviewed  RESP PANEL BY RT-PCR (RSV, FLU A&B, COVID)  [id number]  COMPREHENSIVE METABOLIC PANEL WITH GFR  LIPASE, BLOOD  CBC WITH DIFFERENTIAL/PLATELET    EKG: None  Radiology: DG Chest 2 View Result Date: 07/20/2024 EXAM: 2 VIEW(S) XRAY OF THE CHEST 07/20/2024 10:31:00 AM COMPARISON: None available. CLINICAL HISTORY: Cough FINDINGS: LUNGS AND PLEURA: No focal pulmonary opacity. No pulmonary edema. No pleural effusion. No pneumothorax. HEART AND MEDIASTINUM: No acute abnormality of the cardiac and mediastinal silhouettes. BONES AND SOFT TISSUES: No acute osseous abnormality. IMPRESSION: 1. No acute process. Electronically signed by: Lynwood Seip MD 07/20/2024 11:24 AM EST RP Workstation: [id number]      Procedures   Medications Ordered in the ED  ondansetron  (ZOFRAN -ODT) disintegrating tablet 4 mg (4 mg Oral Given 07/20/24 1006)                                    Medical Decision Making Risk OTC drugs. Prescription drug management.   Patient presents with chills, body aches, cough, nausea and vomiting starting in the last 24 hours.  On arrival patient is initially mildly tachycardic and hypertensive, resolved with Tylenol .  Patient's nausea was  treated with Zofran  and overall symptoms are largely improved today compared to yesterday.  Suspect viral illness, though COVID flu and RSV testing are negative.  Laboratory evaluation shows no leukocytosis and normal hemoglobin, glucose of 252 likely in the setting of infection but with normal anion gap and no other significant electrolyte derangements, 5 units of subcu insulin  given and patient instructed to continue his daily basal insulin .  Chest x-ray obtained, viewed and interpreted independently and agree with radiologist findings, no evidence of pneumonia or other acute cardiopulmonary disease.  Tolerating fluids in the ED and overall feeling better.  Suspect viral illness and encouraged supportive care, prescribed Zofran  and discussed PCP follow-up and return precautions.  Discharged home in good condition.     Final diagnoses:  Viral illness  Nausea and vomiting, unspecified vomiting type  Hyperglycemia    ED Discharge Orders          Ordered    ondansetron  (ZOFRAN -ODT) 4 MG disintegrating tablet  Every 6 hours PRN        07/20/24 1159               Alva Larraine FALCON, PA-C 07/24/24 2217    Freddi Hamilton, MD 07/25/24 [phone]

## 2024-07-20 NOTE — ED Notes (Signed)
 Patient transported to X-ray

## 2024-07-20 NOTE — ED Provider Triage Note (Signed)
 Emergency Medicine Provider Triage Evaluation Note  Thomas Watts , a 63 y.o. male  was evaluated in triage.  Pt complains of chills, N/V and cough. Started to feel sick last night and vomited multiple times. No abdominal pain or diarrhea. No SOB. Does have family members sick with similar symptoms. History of CAD and DM. BS has been running in 200s.  Review of Systems  Positive: Cough, body aches, N/V Negative: Diarrhea, abdominal pain  Physical Exam  BP (!) 147/96   Pulse (!) 107   Temp 97.8 F (36.6 C)   Resp 16   SpO2 92%  Gen:   Awake, no distress   Resp:  Normal effort, lungs CTAB MSK:   Moves extremities without difficulty  Other:  Abd soft, non-tender  Medical Decision Making  Medically screening exam initiated at 9:46 AM.  Appropriate orders placed.  Thomas Watts was informed that the remainder of the evaluation will be completed by another provider, this initial triage assessment does not replace that evaluation, and the importance of remaining in the ED until their evaluation is complete.  Labs and XR ordered with patient's history    Kingsley, Tyreka Henneke K, DO 07/20/24 [phone]

## 2024-07-20 NOTE — ED Triage Notes (Signed)
 Pt. Stated, Yesterday I had some N/V and bad chills, today seems better. I just want to know what's wrong with me.

## 2024-07-20 NOTE — Discharge Instructions (Signed)
 Your symptoms are likely caused by a viral illness, your COVID and flu testing was negative today but there are many other viruses that can cause the symptoms. Antibiotics are not helpful in treating viral infections.  You may use Zofran  as needed for nausea. Please make sure you are drinking plenty of fluids. You can treat your symptoms supportively with tylenol  650 mg and ibuprofen  600 mg every 6 hours for fevers and pains. For nasal congestion you can use Zyrtec and Flonase to help with nasal congestion. To treat cough you can use over the counter cough medications such as Mucinex DM or Robitussin and throat lozenges. If your symptoms are not improving please follow up with you Primary doctor.   If you develop persistent fevers, shortness of breath or difficulty breathing, chest pain, severe headache and neck pain, persistent nausea and vomiting or other new or concerning symptoms return to the Emergency department.

## 2024-08-02 ENCOUNTER — Ambulatory Visit (INDEPENDENT_AMBULATORY_CARE_PROVIDER_SITE_OTHER): Payer: PRIVATE HEALTH INSURANCE | Admitting: "Endocrinology

## 2024-08-02 ENCOUNTER — Encounter: Payer: Self-pay | Admitting: "Endocrinology

## 2024-08-02 VITALS — BP 118/80 | HR 84 | Ht 71.0 in | Wt 212.0 lb

## 2024-08-02 DIAGNOSIS — E1165 Type 2 diabetes mellitus with hyperglycemia: Secondary | ICD-10-CM

## 2024-08-02 DIAGNOSIS — Z794 Long term (current) use of insulin: Secondary | ICD-10-CM | POA: Diagnosis not present

## 2024-08-02 DIAGNOSIS — Z7985 Long-term (current) use of injectable non-insulin antidiabetic drugs: Secondary | ICD-10-CM

## 2024-08-02 DIAGNOSIS — E7800 Pure hypercholesterolemia, unspecified: Secondary | ICD-10-CM | POA: Diagnosis not present

## 2024-08-02 NOTE — Patient Instructions (Signed)
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
## 2024-08-04 ENCOUNTER — Encounter: Payer: Self-pay | Admitting: "Endocrinology

## 2024-09-05 ENCOUNTER — Other Ambulatory Visit: Payer: Self-pay | Admitting: Primary Care

## 2024-09-05 ENCOUNTER — Other Ambulatory Visit (HOSPITAL_COMMUNITY): Payer: Self-pay

## 2024-09-05 DIAGNOSIS — E1139 Type 2 diabetes mellitus with other diabetic ophthalmic complication: Secondary | ICD-10-CM

## 2024-09-06 NOTE — Telephone Encounter (Signed)
 Requested medication (s) are due for refill today: na   Requested medication (s) are on the active medication list: yes   Last refill:  03/06/24 #1 each 0 refills   Future visit scheduled: no   Notes to clinic:   do you want to refill Rx?     Requested Prescriptions  Pending Prescriptions Disp Refills   Continuous Glucose Receiver (DEXCOM G7 RECEIVER) DEVI 1 each 0    Sig: Use to check glucose continuously.     Endocrinology: Diabetes - Testing Supplies Passed - 09/06/2024  3:21 PM      Passed - Valid encounter within last 12 months    Recent Outpatient Visits           8 months ago Medication refill   [place] [facility] Celestia Rosaline SQUIBB, NP   10 months ago Primary hypertension   [place] [facility] Celestia Rosaline SQUIBB, NP   1 year ago Type 2 diabetes mellitus with other ophthalmic complication, with long-term current use of insulin  [facility])   [facility] [place] Fleeta Tonia Senior L, RPH-CPP   1 year ago Type 2 diabetes mellitus with other ophthalmic complication, with long-term current use of insulin  [facility])   [place] [facility] Celestia Rosaline SQUIBB, NP   1 year ago Secondary hypertension   [place] [facility] Celestia Rosaline SQUIBB, NP

## 2024-09-17 ENCOUNTER — Other Ambulatory Visit (INDEPENDENT_AMBULATORY_CARE_PROVIDER_SITE_OTHER): Payer: Self-pay | Admitting: Primary Care

## 2024-09-17 DIAGNOSIS — Z760 Encounter for issue of repeat prescription: Secondary | ICD-10-CM

## 2024-09-17 DIAGNOSIS — I10 Essential (primary) hypertension: Secondary | ICD-10-CM

## 2024-09-21 ENCOUNTER — Other Ambulatory Visit: Payer: Self-pay | Admitting: "Endocrinology

## 2024-09-21 ENCOUNTER — Other Ambulatory Visit (INDEPENDENT_AMBULATORY_CARE_PROVIDER_SITE_OTHER): Payer: Self-pay | Admitting: Primary Care

## 2024-09-21 DIAGNOSIS — Z760 Encounter for issue of repeat prescription: Secondary | ICD-10-CM

## 2024-09-22 NOTE — Telephone Encounter (Signed)
 Pt sees Endo  Providers Discretion

## 2024-09-22 NOTE — Telephone Encounter (Signed)
 Requested medication (s) are due for refill today: Yes  Requested medication (s) are on the active medication list: Yes  Last refill:    Future visit scheduled: No  Notes to clinic:  See request.    Requested Prescriptions  Pending Prescriptions Disp Refills   OZEMPIC , 2 MG/DOSE, 8 MG/3ML SOPN [Pharmacy Med Name: OZEMPIC  8 MG/3 ML (2 MG/DOSE)]  1    Sig: INJECT 2 MG AS DIRECTED ONCE A WEEK.     Endocrinology:  Diabetes - GLP-1 Receptor Agonists - semaglutide  Failed - 09/22/2024 12:48 PM      Failed - HBA1C in normal range and within 180 days    Hemoglobin A1C  Date Value Ref Range Status  07/17/2024 8.8 (A) 4.0 - 5.6 % Final  12/28/2022 8.6  Final   HbA1c, POC (controlled diabetic range)  Date Value Ref Range Status  05/06/2023 10.2 (A) 0.0 - 7.0 % Final   Hgb A1c MFr Bld  Date Value Ref Range Status  09/16/2023 7.7 (H) <5.7 % of total Hgb Final    Comment:    For someone without known diabetes, a hemoglobin A1c value of 6.5% or greater indicates that they may have  diabetes and this should be confirmed with a follow-up  test. . For someone with known diabetes, a value <7% indicates  that their diabetes is well controlled and a value  greater than or equal to 7% indicates suboptimal  control. A1c targets should be individualized based on  duration of diabetes, age, comorbid conditions, and  other considerations. . Currently, no consensus exists regarding use of hemoglobin A1c for diagnosis of diabetes for children. .          Failed - Valid encounter within last 6 months    Recent Outpatient Visits           8 months ago Medication refill   [place] [facility] Celestia Rosaline SQUIBB, NP   10 months ago Primary hypertension   [place] [facility] Celestia Rosaline SQUIBB, NP   1 year ago Type 2 diabetes mellitus with other ophthalmic complication, with long-term current use of insulin  [facility])   [facility] [place]. [facility] Fleeta Morris, [place] L, RPH-CPP   1 year ago Type 2 diabetes mellitus with other ophthalmic complication, with long-term current use of insulin  [facility])   [place] [facility] Celestia Rosaline SQUIBB, NP   1 year ago Secondary hypertension   [place] [facility] Celestia Rosaline SQUIBB, NP              Passed - Cr in normal range and within 360 days    Creat  Date Value Ref Range Status  09/16/2023 0.85 0.70 - 1.35 mg/dL Final   Creatinine, Ser  Date Value Ref Range Status  07/20/2024 0.93 0.61 - 1.24 mg/dL Final   Creatinine, Urine  Date Value Ref Range Status  09/16/2023 218 20 - 320 mg/dL Final          TRESIBA  FLEXTOUCH 100 UNIT/ML FlexTouch Pen [Pharmacy Med Name: TRESIBA  FLEXTOUCH 100 UNIT/ML]  1    Sig: INJECT 36 UNITS INTO THE SKIN DAILY. (ACTUAL 41 DAYS SUPPLY, INS. LIMIT 30 DAYS FOR BILLING)     Endocrinology:  Diabetes - Insulins Failed - 09/22/2024 12:48 PM      Failed - HBA1C is between 0 and 7.9 and within 180 days    Hemoglobin A1C  Date Value Ref Range Status  07/17/2024 8.8 (A) 4.0 - 5.6 % Final  12/28/2022 8.6  Final   HbA1c, POC (controlled diabetic range)  Date Value Ref Range Status  05/06/2023 10.2 (A) 0.0 - 7.0 % Final   Hgb A1c MFr Bld  Date Value Ref Range Status  09/16/2023 7.7 (H) <5.7 % of total Hgb Final    Comment:    For someone without known diabetes, a hemoglobin A1c value of 6.5% or greater indicates that they may have  diabetes and this should be confirmed with a follow-up  test. . For someone with known diabetes, a value <7% indicates  that their diabetes is well controlled and a value  greater than or equal to 7% indicates suboptimal  control. A1c targets should be individualized based on  duration of diabetes, age, comorbid conditions, and  other considerations. . Currently, no consensus exists regarding use of hemoglobin A1c for diagnosis of diabetes for  children. .          Failed - Valid encounter within last 6 months    Recent Outpatient Visits           8 months ago Medication refill   [place] [facility] Celestia Rosaline SQUIBB, NP   10 months ago Primary hypertension   [place] [facility] Celestia Rosaline SQUIBB, NP   1 year ago Type 2 diabetes mellitus with other ophthalmic complication, with long-term current use of insulin  [facility])   [place] [place] Fleeta Tonia Senior L, RPH-CPP   1 year ago Type 2 diabetes mellitus with other ophthalmic complication, with long-term current use of insulin  [facility])   [place] [facility] Celestia Rosaline SQUIBB, NP   1 year ago Secondary hypertension   [place] [facility] Celestia Rosaline SQUIBB, NP

## 2024-10-12 ENCOUNTER — Other Ambulatory Visit (INDEPENDENT_AMBULATORY_CARE_PROVIDER_SITE_OTHER): Payer: Self-pay | Admitting: Primary Care

## 2024-10-12 DIAGNOSIS — Z760 Encounter for issue of repeat prescription: Secondary | ICD-10-CM

## 2024-10-12 DIAGNOSIS — I10 Essential (primary) hypertension: Secondary | ICD-10-CM

## 2024-10-14 ENCOUNTER — Other Ambulatory Visit: Payer: Self-pay | Admitting: "Endocrinology

## 2024-10-15 ENCOUNTER — Emergency Department (HOSPITAL_COMMUNITY): Admission: EM | Admit: 2024-10-15 | Discharge: 2024-10-15 | Disposition: A | Discharge status: Home / Self Care

## 2024-10-15 ENCOUNTER — Emergency Department (HOSPITAL_COMMUNITY)

## 2024-10-15 ENCOUNTER — Other Ambulatory Visit: Payer: Self-pay | Admitting: Primary Care

## 2024-10-15 ENCOUNTER — Other Ambulatory Visit (HOSPITAL_COMMUNITY): Payer: Self-pay

## 2024-10-15 ENCOUNTER — Other Ambulatory Visit: Payer: Self-pay

## 2024-10-15 ENCOUNTER — Encounter (HOSPITAL_COMMUNITY): Payer: Self-pay

## 2024-10-15 DIAGNOSIS — R739 Hyperglycemia, unspecified: Secondary | ICD-10-CM | POA: Diagnosis present

## 2024-10-15 DIAGNOSIS — Z794 Long term (current) use of insulin: Secondary | ICD-10-CM | POA: Insufficient documentation

## 2024-10-15 DIAGNOSIS — E1165 Type 2 diabetes mellitus with hyperglycemia: Secondary | ICD-10-CM | POA: Diagnosis not present

## 2024-10-15 DIAGNOSIS — Z7982 Long term (current) use of aspirin: Secondary | ICD-10-CM | POA: Diagnosis not present

## 2024-10-15 DIAGNOSIS — I252 Old myocardial infarction: Secondary | ICD-10-CM

## 2024-10-15 DIAGNOSIS — E1139 Type 2 diabetes mellitus with other diabetic ophthalmic complication: Secondary | ICD-10-CM

## 2024-10-15 DIAGNOSIS — Z79899 Other long term (current) drug therapy: Secondary | ICD-10-CM | POA: Diagnosis not present

## 2024-10-15 DIAGNOSIS — I2510 Atherosclerotic heart disease of native coronary artery without angina pectoris: Secondary | ICD-10-CM | POA: Diagnosis not present

## 2024-10-15 DIAGNOSIS — I10 Essential (primary) hypertension: Secondary | ICD-10-CM | POA: Insufficient documentation

## 2024-10-15 DIAGNOSIS — Z7984 Long term (current) use of oral hypoglycemic drugs: Secondary | ICD-10-CM | POA: Diagnosis not present

## 2024-10-15 LAB — COMPREHENSIVE METABOLIC PANEL WITH GFR
ALT: 15 U/L (ref 0–44)
AST: 15 U/L (ref 15–41)
Albumin: 3.8 g/dL (ref 3.5–5.0)
Alkaline Phosphatase: 80 U/L (ref 38–126)
Anion gap: 8 (ref 5–15)
BUN: 12 mg/dL (ref 8–23)
CO2: 26 mmol/L (ref 22–32)
Calcium: 9.5 mg/dL (ref 8.9–10.3)
Chloride: 104 mmol/L (ref 98–111)
Creatinine, Ser: 0.93 mg/dL (ref 0.61–1.24)
GFR, Estimated: >60 mL/min
Glucose, Bld: 217 mg/dL — ABNORMAL HIGH (ref 70–99)
Potassium: 3.9 mmol/L (ref 3.5–5.1)
Sodium: 139 mmol/L (ref 135–145)
Total Bilirubin: 0.5 mg/dL (ref 0.0–1.2)
Total Protein: 7.6 g/dL (ref 6.5–8.1)

## 2024-10-15 LAB — BLOOD GAS, VENOUS
Acid-Base Excess: 3.4 mmol/L — ABNORMAL HIGH (ref 0.0–2.0)
Bicarbonate: 26.8 mmol/L (ref 20.0–28.0)
O2 Saturation: 96.1 %
Patient temperature: 37
pCO2, Ven: 36 mmHg — ABNORMAL LOW (ref 44–60)
pH, Ven: 7.48 — ABNORMAL HIGH (ref 7.25–7.43)
pO2, Ven: 64 mmHg — ABNORMAL HIGH (ref 32–45)

## 2024-10-15 LAB — CBC
HCT: 40.4 % (ref 39.0–52.0)
Hemoglobin: 13.9 g/dL (ref 13.0–17.0)
MCH: 29.6 pg (ref 26.0–34.0)
MCHC: 34.4 g/dL (ref 30.0–36.0)
MCV: 86.1 fL (ref 80.0–100.0)
Platelets: 262 10*3/uL (ref 150–400)
RBC: 4.69 MIL/uL (ref 4.22–5.81)
RDW: 13.7 % (ref 11.5–15.5)
WBC: 4.3 10*3/uL (ref 4.0–10.5)
nRBC: 0 % (ref 0.0–0.2)

## 2024-10-15 LAB — CBG MONITORING, ED: Glucose-Capillary: 201 mg/dL — ABNORMAL HIGH (ref 70–99)

## 2024-10-15 LAB — LIPASE, BLOOD: Lipase: 35 U/L (ref 11–51)

## 2024-10-15 NOTE — Discharge Instructions (Signed)
 Please follow up with your primary doctor for you elevated blood sugars. Return for headache, vision loss, chest pain, shortness of breath, abdominal pain, uncontrolled nausea and vomiting or any new or worsening symptoms that are concerning to you.  Please take your normal insulin  at the scheduled time today.

## 2024-10-15 NOTE — ED Triage Notes (Signed)
 Patient woke up this morning and felt lightheaded, nauseous, blurred vision. He said his dexcom is ready 300 for his sugar. [place] he takes a one time shot for his insulin . Also needs a dexcom refill.

## 2024-10-16 ENCOUNTER — Other Ambulatory Visit (HOSPITAL_COMMUNITY): Payer: Self-pay

## 2024-10-16 MED ORDER — ASPIRIN 81 MG PO TBEC
81.0000 mg | DELAYED_RELEASE_TABLET | Freq: Every day | ORAL | 2 refills | Status: AC
  Filled 2024-10-16: qty 90, 90d supply, fill #0

## 2024-10-18 ENCOUNTER — Other Ambulatory Visit: Payer: Self-pay

## 2024-10-18 ENCOUNTER — Other Ambulatory Visit (INDEPENDENT_AMBULATORY_CARE_PROVIDER_SITE_OTHER): Payer: Self-pay | Admitting: Primary Care

## 2024-10-18 ENCOUNTER — Other Ambulatory Visit (HOSPITAL_COMMUNITY): Payer: Self-pay

## 2024-10-18 ENCOUNTER — Encounter (HOSPITAL_COMMUNITY): Payer: Self-pay | Admitting: Pharmacist

## 2024-10-18 DIAGNOSIS — Z760 Encounter for issue of repeat prescription: Secondary | ICD-10-CM

## 2024-10-18 DIAGNOSIS — I10 Essential (primary) hypertension: Secondary | ICD-10-CM

## 2024-10-18 MED ORDER — OFLOXACIN 0.3 % OP SOLN
1.0000 [drp] | Freq: Four times a day (QID) | OPHTHALMIC | 0 refills | Status: AC
  Filled 2024-10-18: qty 5, 25d supply, fill #0

## 2024-10-19 ENCOUNTER — Other Ambulatory Visit: Payer: Self-pay

## 2024-10-19 ENCOUNTER — Other Ambulatory Visit (HOSPITAL_COMMUNITY): Payer: Self-pay

## 2024-10-20 ENCOUNTER — Other Ambulatory Visit: Payer: Self-pay
